# Patient Record
Sex: Female | Born: 1945
Health system: Southern US, Community
[De-identification: ages and names within clinical notes are randomized; demographics above are authoritative.]

## PROBLEM LIST (undated history)

## (undated) DIAGNOSIS — E785 Hyperlipidemia, unspecified: Secondary | ICD-10-CM

## (undated) DIAGNOSIS — E119 Type 2 diabetes mellitus without complications: Secondary | ICD-10-CM

## (undated) HISTORY — DX: Hyperlipidemia, unspecified: E78.5

## (undated) HISTORY — DX: Type 2 diabetes mellitus without complications: E11.9

---

## 1999-05-11 ENCOUNTER — Other Ambulatory Visit: Admission: RE | Admit: 1999-05-11 | Discharge: 1999-05-11 | Payer: Self-pay | Admitting: Family Medicine

## 2003-01-19 ENCOUNTER — Other Ambulatory Visit: Admission: RE | Admit: 2003-01-19 | Discharge: 2003-01-19 | Payer: Self-pay | Admitting: Family Medicine

## 2003-06-06 ENCOUNTER — Encounter: Admission: RE | Admit: 2003-06-06 | Discharge: 2003-06-06 | Payer: Self-pay | Admitting: Family Medicine

## 2004-02-16 ENCOUNTER — Other Ambulatory Visit: Admission: RE | Admit: 2004-02-16 | Discharge: 2004-02-16 | Payer: Self-pay | Admitting: Family Medicine

## 2004-09-14 ENCOUNTER — Encounter: Admission: RE | Admit: 2004-09-14 | Discharge: 2004-09-14 | Payer: Self-pay | Admitting: Family Medicine

## 2005-07-15 ENCOUNTER — Other Ambulatory Visit: Admission: RE | Admit: 2005-07-15 | Discharge: 2005-07-15 | Payer: Self-pay | Admitting: Family Medicine

## 2005-08-07 ENCOUNTER — Ambulatory Visit: Payer: Self-pay | Admitting: Gastroenterology

## 2005-08-27 ENCOUNTER — Ambulatory Visit: Payer: Self-pay | Admitting: Gastroenterology

## 2005-11-26 ENCOUNTER — Encounter: Admission: RE | Admit: 2005-11-26 | Discharge: 2005-11-26 | Payer: Self-pay | Admitting: Family Medicine

## 2006-12-22 ENCOUNTER — Encounter: Admission: RE | Admit: 2006-12-22 | Discharge: 2006-12-22 | Payer: Self-pay | Admitting: Family Medicine

## 2008-04-26 ENCOUNTER — Encounter: Admission: RE | Admit: 2008-04-26 | Discharge: 2008-04-26 | Payer: Self-pay | Admitting: Family Medicine

## 2009-06-06 ENCOUNTER — Encounter: Admission: RE | Admit: 2009-06-06 | Discharge: 2009-06-06 | Payer: Self-pay | Admitting: Geriatric Medicine

## 2010-11-08 ENCOUNTER — Other Ambulatory Visit: Payer: Self-pay | Admitting: Geriatric Medicine

## 2010-11-08 DIAGNOSIS — Z1231 Encounter for screening mammogram for malignant neoplasm of breast: Secondary | ICD-10-CM

## 2010-11-19 ENCOUNTER — Ambulatory Visit
Admission: RE | Admit: 2010-11-19 | Discharge: 2010-11-19 | Disposition: A | Discharge status: Home / Self Care | Payer: Medicare Other | Source: Ambulatory Visit | Attending: Geriatric Medicine | Admitting: Geriatric Medicine

## 2010-11-19 DIAGNOSIS — Z1231 Encounter for screening mammogram for malignant neoplasm of breast: Secondary | ICD-10-CM

## 2014-01-13 ENCOUNTER — Encounter: Payer: Self-pay | Admitting: *Deleted

## 2015-03-31 ENCOUNTER — Other Ambulatory Visit: Payer: Self-pay

## 2015-03-31 DIAGNOSIS — Z1231 Encounter for screening mammogram for malignant neoplasm of breast: Secondary | ICD-10-CM

## 2015-04-13 ENCOUNTER — Ambulatory Visit: Payer: Medicare Other

## 2015-04-20 ENCOUNTER — Ambulatory Visit
Admission: RE | Admit: 2015-04-20 | Discharge: 2015-04-20 | Disposition: A | Discharge status: Home / Self Care | Payer: Medicare Other | Source: Ambulatory Visit

## 2015-04-20 DIAGNOSIS — Z1231 Encounter for screening mammogram for malignant neoplasm of breast: Secondary | ICD-10-CM

## 2015-08-31 ENCOUNTER — Encounter: Payer: Self-pay | Admitting: Gastroenterology

## 2017-04-01 ENCOUNTER — Other Ambulatory Visit: Payer: Self-pay | Admitting: Geriatric Medicine

## 2017-04-01 DIAGNOSIS — Z1231 Encounter for screening mammogram for malignant neoplasm of breast: Secondary | ICD-10-CM

## 2017-04-18 ENCOUNTER — Ambulatory Visit: Payer: Medicare Other

## 2017-04-24 ENCOUNTER — Ambulatory Visit: Payer: Medicare Other

## 2017-05-09 ENCOUNTER — Ambulatory Visit
Admission: RE | Admit: 2017-05-09 | Discharge: 2017-05-09 | Disposition: A | Discharge status: Home / Self Care | Payer: Medicare Other | Source: Ambulatory Visit | Attending: Geriatric Medicine | Admitting: Geriatric Medicine

## 2017-05-09 ENCOUNTER — Encounter: Payer: Self-pay | Admitting: Radiology

## 2017-05-09 DIAGNOSIS — Z1231 Encounter for screening mammogram for malignant neoplasm of breast: Secondary | ICD-10-CM

## 2019-08-06 ENCOUNTER — Ambulatory Visit: Payer: Medicare PPO | Attending: Internal Medicine

## 2019-08-06 DIAGNOSIS — Z23 Encounter for immunization: Secondary | ICD-10-CM | POA: Insufficient documentation

## 2019-08-06 NOTE — Progress Notes (Signed)
   Covid-19 Vaccination Clinic  Name:  MELYNA HURON    MRN: [id number] DOB: Jun 18, 1945  08/06/2019  Ms. Broxterman was observed post Covid-19 immunization for 15 minutes without incidence. She was provided with Vaccine Information Sheet and instruction to access the V-Safe system.   Ms. Capell was instructed to call 911 with any severe reactions post vaccine: Marland Kitchen Difficulty breathing  . Swelling of your face and throat  . A fast heartbeat  . A bad rash all over your body  . Dizziness and weakness    Immunizations Administered    Name Date Dose VIS Date Route   Pfizer COVID-19 Vaccine 08/06/2019  8:23 AM 0.3 mL 05/21/2019 Intramuscular   Manufacturer: [employer], [employer]   Lot: [id number]   NDC: [address]-3

## 2019-08-31 ENCOUNTER — Ambulatory Visit: Payer: Medicare PPO | Attending: Internal Medicine

## 2019-08-31 DIAGNOSIS — Z23 Encounter for immunization: Secondary | ICD-10-CM

## 2019-08-31 NOTE — Progress Notes (Signed)
   Covid-19 Vaccination Clinic  Name:  VAIDEHI BRADDY    MRN: [id number] DOB: 1945-11-12  08/31/2019  Ms. Babineau was observed post Covid-19 immunization for 15 minutes without incident. She was provided with Vaccine Information Sheet and instruction to access the V-Safe system.   Ms. Dossantos was instructed to call 911 with any severe reactions post vaccine: Marland Kitchen Difficulty breathing  . Swelling of face and throat  . A fast heartbeat  . A bad rash all over body  . Dizziness and weakness   Immunizations Administered    Name Date Dose VIS Date Route   Pfizer COVID-19 Vaccine 08/31/2019  8:30 AM 0.3 mL 05/21/2019 Intramuscular   Manufacturer: [employer], [employer]   Lot: [id number]   NDC: [address]-0

## 2019-10-03 DIAGNOSIS — M810 Age-related osteoporosis without current pathological fracture: Secondary | ICD-10-CM | POA: Diagnosis not present

## 2019-10-03 DIAGNOSIS — Z7984 Long term (current) use of oral hypoglycemic drugs: Secondary | ICD-10-CM | POA: Diagnosis not present

## 2019-10-03 DIAGNOSIS — I10 Essential (primary) hypertension: Secondary | ICD-10-CM | POA: Diagnosis not present

## 2019-10-03 DIAGNOSIS — E1165 Type 2 diabetes mellitus with hyperglycemia: Secondary | ICD-10-CM | POA: Diagnosis not present

## 2019-10-03 DIAGNOSIS — E1169 Type 2 diabetes mellitus with other specified complication: Secondary | ICD-10-CM | POA: Diagnosis not present

## 2019-10-03 DIAGNOSIS — E7800 Pure hypercholesterolemia, unspecified: Secondary | ICD-10-CM | POA: Diagnosis not present

## 2019-11-01 DIAGNOSIS — E7800 Pure hypercholesterolemia, unspecified: Secondary | ICD-10-CM | POA: Diagnosis not present

## 2019-11-01 DIAGNOSIS — E1169 Type 2 diabetes mellitus with other specified complication: Secondary | ICD-10-CM | POA: Diagnosis not present

## 2019-11-01 DIAGNOSIS — M810 Age-related osteoporosis without current pathological fracture: Secondary | ICD-10-CM | POA: Diagnosis not present

## 2019-11-01 DIAGNOSIS — E1165 Type 2 diabetes mellitus with hyperglycemia: Secondary | ICD-10-CM | POA: Diagnosis not present

## 2019-11-01 DIAGNOSIS — I10 Essential (primary) hypertension: Secondary | ICD-10-CM | POA: Diagnosis not present

## 2019-11-24 DIAGNOSIS — E7800 Pure hypercholesterolemia, unspecified: Secondary | ICD-10-CM | POA: Diagnosis not present

## 2019-11-24 DIAGNOSIS — E1169 Type 2 diabetes mellitus with other specified complication: Secondary | ICD-10-CM | POA: Diagnosis not present

## 2019-11-24 DIAGNOSIS — I10 Essential (primary) hypertension: Secondary | ICD-10-CM | POA: Diagnosis not present

## 2019-11-24 DIAGNOSIS — E1165 Type 2 diabetes mellitus with hyperglycemia: Secondary | ICD-10-CM | POA: Diagnosis not present

## 2019-11-24 DIAGNOSIS — M810 Age-related osteoporosis without current pathological fracture: Secondary | ICD-10-CM | POA: Diagnosis not present

## 2019-12-21 DIAGNOSIS — M810 Age-related osteoporosis without current pathological fracture: Secondary | ICD-10-CM | POA: Diagnosis not present

## 2019-12-21 DIAGNOSIS — E7800 Pure hypercholesterolemia, unspecified: Secondary | ICD-10-CM | POA: Diagnosis not present

## 2019-12-21 DIAGNOSIS — I10 Essential (primary) hypertension: Secondary | ICD-10-CM | POA: Diagnosis not present

## 2019-12-21 DIAGNOSIS — E1165 Type 2 diabetes mellitus with hyperglycemia: Secondary | ICD-10-CM | POA: Diagnosis not present

## 2019-12-21 DIAGNOSIS — E1169 Type 2 diabetes mellitus with other specified complication: Secondary | ICD-10-CM | POA: Diagnosis not present

## 2020-02-08 DIAGNOSIS — E1169 Type 2 diabetes mellitus with other specified complication: Secondary | ICD-10-CM | POA: Diagnosis not present

## 2020-02-08 DIAGNOSIS — Z7984 Long term (current) use of oral hypoglycemic drugs: Secondary | ICD-10-CM | POA: Diagnosis not present

## 2020-02-08 DIAGNOSIS — E1165 Type 2 diabetes mellitus with hyperglycemia: Secondary | ICD-10-CM | POA: Diagnosis not present

## 2020-02-08 DIAGNOSIS — I10 Essential (primary) hypertension: Secondary | ICD-10-CM | POA: Diagnosis not present

## 2020-02-08 DIAGNOSIS — E7800 Pure hypercholesterolemia, unspecified: Secondary | ICD-10-CM | POA: Diagnosis not present

## 2020-02-08 DIAGNOSIS — M810 Age-related osteoporosis without current pathological fracture: Secondary | ICD-10-CM | POA: Diagnosis not present

## 2020-02-27 ENCOUNTER — Other Ambulatory Visit: Payer: Self-pay

## 2020-02-27 ENCOUNTER — Encounter (HOSPITAL_COMMUNITY): Payer: Self-pay

## 2020-02-27 ENCOUNTER — Emergency Department (HOSPITAL_COMMUNITY): Payer: Medicare PPO

## 2020-02-27 ENCOUNTER — Inpatient Hospital Stay (HOSPITAL_COMMUNITY)
Admission: EM | Admit: 2020-02-27 | Discharge: 2020-03-03 | DRG: 814 | Disposition: A | Discharge status: Home Health | Payer: Medicare PPO | Attending: General Surgery | Admitting: General Surgery

## 2020-02-27 DIAGNOSIS — S36039A Unspecified laceration of spleen, initial encounter: Secondary | ICD-10-CM

## 2020-02-27 DIAGNOSIS — Z7984 Long term (current) use of oral hypoglycemic drugs: Secondary | ICD-10-CM

## 2020-02-27 DIAGNOSIS — Z7983 Long term (current) use of bisphosphonates: Secondary | ICD-10-CM

## 2020-02-27 DIAGNOSIS — W19XXXA Unspecified fall, initial encounter: Secondary | ICD-10-CM | POA: Diagnosis not present

## 2020-02-27 DIAGNOSIS — N390 Urinary tract infection, site not specified: Secondary | ICD-10-CM | POA: Diagnosis not present

## 2020-02-27 DIAGNOSIS — Z20822 Contact with and (suspected) exposure to covid-19: Secondary | ICD-10-CM | POA: Diagnosis not present

## 2020-02-27 DIAGNOSIS — K661 Hemoperitoneum: Secondary | ICD-10-CM | POA: Diagnosis not present

## 2020-02-27 DIAGNOSIS — I959 Hypotension, unspecified: Secondary | ICD-10-CM | POA: Diagnosis not present

## 2020-02-27 DIAGNOSIS — E119 Type 2 diabetes mellitus without complications: Secondary | ICD-10-CM | POA: Diagnosis present

## 2020-02-27 DIAGNOSIS — W109XXA Fall (on) (from) unspecified stairs and steps, initial encounter: Secondary | ICD-10-CM | POA: Diagnosis present

## 2020-02-27 DIAGNOSIS — R42 Dizziness and giddiness: Secondary | ICD-10-CM | POA: Diagnosis not present

## 2020-02-27 DIAGNOSIS — S2242XA Multiple fractures of ribs, left side, initial encounter for closed fracture: Secondary | ICD-10-CM | POA: Diagnosis present

## 2020-02-27 DIAGNOSIS — E785 Hyperlipidemia, unspecified: Secondary | ICD-10-CM | POA: Diagnosis present

## 2020-02-27 DIAGNOSIS — I10 Essential (primary) hypertension: Secondary | ICD-10-CM | POA: Diagnosis not present

## 2020-02-27 DIAGNOSIS — K760 Fatty (change of) liver, not elsewhere classified: Secondary | ICD-10-CM | POA: Diagnosis not present

## 2020-02-27 DIAGNOSIS — Y92008 Other place in unspecified non-institutional (private) residence as the place of occurrence of the external cause: Secondary | ICD-10-CM | POA: Diagnosis not present

## 2020-02-27 DIAGNOSIS — M545 Low back pain: Secondary | ICD-10-CM | POA: Diagnosis not present

## 2020-02-27 DIAGNOSIS — B9620 Unspecified Escherichia coli [E. coli] as the cause of diseases classified elsewhere: Secondary | ICD-10-CM | POA: Diagnosis present

## 2020-02-27 DIAGNOSIS — Z043 Encounter for examination and observation following other accident: Secondary | ICD-10-CM | POA: Diagnosis not present

## 2020-02-27 DIAGNOSIS — S36030A Superficial (capsular) laceration of spleen, initial encounter: Secondary | ICD-10-CM | POA: Diagnosis not present

## 2020-02-27 DIAGNOSIS — S3681XA Injury of peritoneum, initial encounter: Secondary | ICD-10-CM | POA: Diagnosis not present

## 2020-02-27 DIAGNOSIS — Z79899 Other long term (current) drug therapy: Secondary | ICD-10-CM

## 2020-02-27 DIAGNOSIS — R55 Syncope and collapse: Secondary | ICD-10-CM | POA: Diagnosis not present

## 2020-02-27 DIAGNOSIS — S36031A Moderate laceration of spleen, initial encounter: Secondary | ICD-10-CM | POA: Diagnosis not present

## 2020-02-27 DIAGNOSIS — I371 Nonrheumatic pulmonary valve insufficiency: Secondary | ICD-10-CM | POA: Diagnosis not present

## 2020-02-27 DIAGNOSIS — R296 Repeated falls: Secondary | ICD-10-CM | POA: Diagnosis present

## 2020-02-27 LAB — COMPREHENSIVE METABOLIC PANEL
ALT: 19 U/L (ref 0–44)
AST: 23 U/L (ref 15–41)
Albumin: 3.6 g/dL (ref 3.5–5.0)
Alkaline Phosphatase: 66 U/L (ref 38–126)
Anion gap: 8 (ref 5–15)
BUN: 23 mg/dL (ref 8–23)
CO2: 26 mmol/L (ref 22–32)
Calcium: 8 mg/dL — ABNORMAL LOW (ref 8.9–10.3)
Chloride: 104 mmol/L (ref 98–111)
Creatinine, Ser: 0.83 mg/dL (ref 0.44–1.00)
GFR calc Af Amer: >60 mL/min (ref >60)
GFR calc non Af Amer: >60 mL/min (ref >60)
Glucose, Bld: 220 mg/dL — ABNORMAL HIGH (ref 70–99)
Potassium: 4.7 mmol/L (ref 3.5–5.1)
Sodium: 138 mmol/L (ref 135–145)
Total Bilirubin: 0.5 mg/dL (ref 0.3–1.2)
Total Protein: 6.7 g/dL (ref 6.5–8.1)

## 2020-02-27 LAB — CBC WITH DIFFERENTIAL/PLATELET
Abs Immature Granulocytes: 0.09 10*3/uL — ABNORMAL HIGH (ref 0.00–0.07)
Basophils Absolute: 0 10*3/uL (ref 0.0–0.1)
Basophils Relative: 0 %
Eosinophils Absolute: 0.1 10*3/uL (ref 0.0–0.5)
Eosinophils Relative: 1 %
HCT: 25.1 % — ABNORMAL LOW (ref 36.0–46.0)
Hemoglobin: 8.4 g/dL — ABNORMAL LOW (ref 12.0–15.0)
Immature Granulocytes: 1 %
Lymphocytes Relative: 9 %
Lymphs Abs: 1.4 10*3/uL (ref 0.7–4.0)
MCH: 32.2 pg (ref 26.0–34.0)
MCHC: 33.5 g/dL (ref 30.0–36.0)
MCV: 96.2 fL (ref 80.0–100.0)
Monocytes Absolute: 1 10*3/uL (ref 0.1–1.0)
Monocytes Relative: 7 %
Neutro Abs: 12 10*3/uL — ABNORMAL HIGH (ref 1.7–7.7)
Neutrophils Relative %: 82 %
Platelets: 127 10*3/uL — ABNORMAL LOW (ref 150–400)
RBC: 2.61 MIL/uL — ABNORMAL LOW (ref 3.87–5.11)
RDW: 12.5 % (ref 11.5–15.5)
WBC: 14.7 10*3/uL — ABNORMAL HIGH (ref 4.0–10.5)
nRBC: 0 % (ref 0.0–0.2)

## 2020-02-27 LAB — TYPE AND SCREEN
ABO/RH(D): O NEG
Antibody Screen: NEGATIVE

## 2020-02-27 LAB — CBC
HCT: 21.3 % — ABNORMAL LOW (ref 36.0–46.0)
Hemoglobin: 7 g/dL — ABNORMAL LOW (ref 12.0–15.0)
MCH: 31.4 pg (ref 26.0–34.0)
MCHC: 32.9 g/dL (ref 30.0–36.0)
MCV: 95.5 fL (ref 80.0–100.0)
Platelets: 132 10*3/uL — ABNORMAL LOW (ref 150–400)
RBC: 2.23 MIL/uL — ABNORMAL LOW (ref 3.87–5.11)
RDW: 12.4 % (ref 11.5–15.5)
WBC: 10.5 10*3/uL (ref 4.0–10.5)
nRBC: 0 % (ref 0.0–0.2)

## 2020-02-27 LAB — CBG MONITORING, ED: Glucose-Capillary: 203 mg/dL — ABNORMAL HIGH (ref 70–99)

## 2020-02-27 LAB — HEMOGLOBIN A1C
Hgb A1c MFr Bld: 7.9 % — ABNORMAL HIGH (ref 4.8–5.6)
Mean Plasma Glucose: 180.03 mg/dL

## 2020-02-27 LAB — GLUCOSE, CAPILLARY: Glucose-Capillary: 251 mg/dL — ABNORMAL HIGH (ref 70–99)

## 2020-02-27 LAB — TROPONIN I (HIGH SENSITIVITY): Troponin I (High Sensitivity): 3 ng/L (ref <18)

## 2020-02-27 LAB — POC OCCULT BLOOD, ED: Fecal Occult Bld: NEGATIVE

## 2020-02-27 LAB — MRSA PCR SCREENING: MRSA by PCR: NEGATIVE

## 2020-02-27 LAB — SARS CORONAVIRUS 2 BY RT PCR (HOSPITAL ORDER, PERFORMED IN ~~LOC~~ HOSPITAL LAB): SARS Coronavirus 2: NEGATIVE

## 2020-02-27 MED ORDER — IOHEXOL 350 MG/ML SOLN
100.0000 mL | Freq: Once | INTRAVENOUS | Status: AC | PRN
  Administered 2020-02-27: 100 mL via INTRAVENOUS

## 2020-02-27 MED ORDER — INSULIN ASPART 100 UNIT/ML ~~LOC~~ SOLN: 0.0000 [IU] | SUBCUTANEOUS | Status: DC

## 2020-02-27 MED ORDER — SODIUM CHLORIDE 0.9 % IV SOLN: INTRAVENOUS | Status: DC

## 2020-02-27 MED ORDER — IOHEXOL 300 MG/ML  SOLN: 100.0000 mL | Freq: Once | INTRAMUSCULAR | Status: DC | PRN

## 2020-02-27 MED ORDER — MORPHINE SULFATE (PF) 2 MG/ML IV SOLN: 1.0000 mg | INTRAVENOUS | Status: DC | PRN

## 2020-02-27 MED ORDER — ATORVASTATIN CALCIUM 40 MG PO TABS
40.0000 mg | ORAL_TABLET | Freq: Every day | ORAL | Status: DC
  Administered 2020-02-27 – 2020-03-02 (×5): 40 mg via ORAL
  Filled 2020-02-27 (×5): qty 1

## 2020-02-27 MED ORDER — ALENDRONATE SODIUM 70 MG PO TABS: 70.0000 mg | ORAL_TABLET | ORAL | Status: DC

## 2020-02-27 MED ORDER — EZETIMIBE 10 MG PO TABS
10.0000 mg | ORAL_TABLET | Freq: Every morning | ORAL | Status: DC
  Administered 2020-02-28 – 2020-03-03 (×5): 10 mg via ORAL
  Filled 2020-02-27 (×5): qty 1

## 2020-02-27 MED ORDER — PANTOPRAZOLE SODIUM 40 MG PO TBEC
40.0000 mg | DELAYED_RELEASE_TABLET | Freq: Every day | ORAL | Status: DC
  Administered 2020-02-27 – 2020-03-03 (×6): 40 mg via ORAL
  Filled 2020-02-27 (×6): qty 1

## 2020-02-27 MED ORDER — PANTOPRAZOLE SODIUM 40 MG IV SOLR: 40.0000 mg | Freq: Every day | INTRAVENOUS | Status: DC

## 2020-02-27 MED ORDER — INSULIN ASPART 100 UNIT/ML ~~LOC~~ SOLN
0.0000 [IU] | SUBCUTANEOUS | Status: DC
  Administered 2020-02-27: 8 [IU] via SUBCUTANEOUS
  Administered 2020-02-28: 5 [IU] via SUBCUTANEOUS

## 2020-02-27 MED ORDER — ONDANSETRON 4 MG PO TBDP: 4.0000 mg | ORAL_TABLET | Freq: Four times a day (QID) | ORAL | Status: DC | PRN

## 2020-02-27 MED ORDER — SODIUM CHLORIDE 0.9 % IV BOLUS
1000.0000 mL | Freq: Once | INTRAVENOUS | Status: AC
  Administered 2020-02-27: 1000 mL via INTRAVENOUS

## 2020-02-27 MED ORDER — ONDANSETRON HCL 4 MG/2ML IJ SOLN
4.0000 mg | Freq: Four times a day (QID) | INTRAMUSCULAR | Status: DC | PRN
  Administered 2020-02-27 – 2020-02-29 (×2): 4 mg via INTRAVENOUS
  Filled 2020-02-27 (×2): qty 2

## 2020-02-27 MED ORDER — HYDROMORPHONE HCL 1 MG/ML IJ SOLN
0.5000 mg | Freq: Once | INTRAMUSCULAR | Status: DC
  Filled 2020-02-27: qty 1

## 2020-02-27 NOTE — ED Notes (Signed)
Report called to Gardiner Ramus, RN at [facility] ICU

## 2020-02-27 NOTE — Progress Notes (Signed)
Patient received on 4NICU.

## 2020-02-27 NOTE — ED Notes (Signed)
[facility] called for pt transport  

## 2020-02-27 NOTE — H&P (Signed)
Norma Clark is an 74 y.o. female.   Chief Complaint: Fall HPI: The patient is a 74 year old white female who presents after standing up and getting lightheaded and falling down a couple of stairs at home today.  She complains of some left-sided chest pain and some abdominal pain.  She denies any neck pain.  She denies any headaches.  She states that she also fell a week ago on the left side as well and at that time she felt like she broke some ribs.  She never went to the doctor to be worked up.  Past Medical History:  Diagnosis Date  . Diabetes mellitus without complication (HCC)   . Hyperlipidemia     History reviewed. No pertinent surgical history.  Family History  Problem Relation Age of Onset  . Alcohol abuse Mother   . Heart failure Father    Social History:  reports that she has never smoked. She has never used smokeless tobacco. She reports that she does not drink alcohol and does not use drugs.  Allergies: No Known Allergies  (Not in a hospital admission)   Results for orders placed or performed during the hospital encounter of 02/27/20 (from the past 48 hour(s))  CBG monitoring, ED     Status: Abnormal   Collection Time: 02/27/20  4:02 PM  Result Value Ref Range   Glucose-Capillary 203 (H) 70 - 99 mg/dL    Comment: Glucose reference range applies only to samples taken after fasting for at least 8 hours.  CBC WITH DIFFERENTIAL     Status: Abnormal   Collection Time: 02/27/20  4:25 PM  Result Value Ref Range   WBC 14.7 (H) 4.0 - 10.5 K/uL   RBC 2.61 (L) 3.87 - 5.11 MIL/uL   Hemoglobin 8.4 (L) 12.0 - 15.0 g/dL   HCT 29.5 (L) 36 - 46 %   MCV 96.2 80.0 - 100.0 fL   MCH 32.2 26.0 - 34.0 pg   MCHC 33.5 30.0 - 36.0 g/dL   RDW 28.4 13.2 - 44.0 %   Platelets 127 (L) 150 - 400 K/uL   nRBC 0.0 0.0 - 0.2 %   Neutrophils Relative % 82 %   Neutro Abs 12.0 (H) 1.7 - 7.7 K/uL   Lymphocytes Relative 9 %   Lymphs Abs 1.4 0.7 - 4.0 K/uL   Monocytes Relative 7 %    Monocytes Absolute 1.0 0 - 1 K/uL   Eosinophils Relative 1 %   Eosinophils Absolute 0.1 0 - 0 K/uL   Basophils Relative 0 %   Basophils Absolute 0.0 0 - 0 K/uL   Immature Granulocytes 1 %   Abs Immature Granulocytes 0.09 (H) 0.00 - 0.07 K/uL    Comment: Performed at [facility], [address], [place], [place] [address]  Comprehensive metabolic panel     Status: Abnormal   Collection Time: 02/27/20  4:25 PM  Result Value Ref Range   Sodium 138 135 - 145 mmol/L   Potassium 4.7 3.5 - 5.1 mmol/L   Chloride 104 98 - 111 mmol/L   CO2 26 22 - 32 mmol/L   Glucose, Bld 220 (H) 70 - 99 mg/dL    Comment: Glucose reference range applies only to samples taken after fasting for at least 8 hours.   BUN 23 8 - 23 mg/dL   Creatinine, Ser 5.36 0.44 - 1.00 mg/dL   Calcium 8.0 (L) 8.9 - 10.3 mg/dL   Total Protein 6.7 6.5 - 8.1 g/dL  Albumin 3.6 3.5 - 5.0 g/dL   AST 23 15 - 41 U/L   ALT 19 0 - 44 U/L   Alkaline Phosphatase 66 38 - 126 U/L   Total Bilirubin 0.5 0.3 - 1.2 mg/dL   GFR calc non Af Amer >60 >60 mL/min   GFR calc Af Amer >60 >60 mL/min   Anion gap 8 5 - 15    Comment: Performed at [facility], [address] [place], [place] [address]  Troponin I (High Sensitivity)     Status: None   Collection Time: 02/27/20  4:25 PM  Result Value Ref Range   Troponin I (High Sensitivity) 3 <18 ng/L    Comment: (NOTE) Elevated high sensitivity troponin I (hsTnI) values and significant  changes across serial measurements may suggest ACS but many other  chronic and acute conditions are known to elevate hsTnI results.  Refer to the "Links" section for chest pain algorithms and additional  guidance. Performed at [facility], [address], [place], [place] [address]   POC occult blood, ED Provider will collect     Status: None   Collection Time: 02/27/20  4:48 PM  Result Value Ref Range   Fecal Occult Bld NEGATIVE NEGATIVE   DG Ribs  Unilateral W/Chest Left  Result Date: 02/27/2020 CLINICAL DATA:  Fall EXAM: LEFT RIBS AND CHEST - 3+ VIEW COMPARISON:  None. FINDINGS: There is minimally displaced fractures of the lateral left sixth, seventh, and ninth ribs. No pneumothorax is noted. No focal airspace consolidation. There is no evidence of pneumothorax or pleural effusion. Both lungs are clear. There is mild cardiomegaly. Aortic knob calcifications are seen. IMPRESSION: Mildly displaced lateral sixth, seventh, and ninth left rib fractures. Electronically Signed   By: Jonna Clark M.D.   On: 02/27/2020 17:42   DG Lumbar Spine Complete  Result Date: 02/27/2020 CLINICAL DATA:  Patient fell 2 days ago and then again today.  Pain. EXAM: LUMBAR SPINE - COMPLETE 4+ VIEW COMPARISON:  None. FINDINGS: Mild multilevel degenerative disc disease. No fracture or traumatic malalignment. Minimal calcified atherosclerosis in the abdominal aorta. IMPRESSION: 1. No fracture or traumatic malalignment. 2. Mild degenerative changes. 3. Mild calcified atherosclerosis in the abdominal aorta. Electronically Signed   By: Gerome Sam III M.D   On: 02/27/2020 17:44   DG Elbow Complete Left  Result Date: 02/27/2020 CLINICAL DATA:  Fall EXAM: LEFT ELBOW - COMPLETE 3+ VIEW COMPARISON:  None. FINDINGS: There is no evidence of fracture, dislocation, or joint effusion. There is no evidence of arthropathy or other focal bone abnormality. Soft tissues are unremarkable. IMPRESSION: Negative. Electronically Signed   By: Jonna Clark M.D.   On: 02/27/2020 17:40   CT Head Wo Contrast  Result Date: 02/27/2020 CLINICAL DATA:  Nonspecific dizziness.  Two falls in the past week. EXAM: CT HEAD WITHOUT CONTRAST TECHNIQUE: Contiguous axial images were obtained from the base of the skull through the vertex without intravenous contrast. COMPARISON:  None. FINDINGS: Brain: No evidence of acute infarction, hemorrhage, hydrocephalus, extra-axial collection or mass lesion/mass  effect. Vascular: No hyperdense vessel or unexpected calcification. Skull: Normal. Negative for fracture or focal lesion. Sinuses/Orbits: No acute finding. Other: None. IMPRESSION: No acute intracranial abnormalities. No cause for the patient's symptoms identified. Electronically Signed   By: Gerome Sam III M.D   On: 02/27/2020 18:37   CT Angio Chest PE W and/or Wo Contrast  Result Date: 02/27/2020 CLINICAL DATA:  Multiple falls x1 week. EXAM: CT ANGIOGRAPHY  CHEST WITH CONTRAST TECHNIQUE: Multidetector CT imaging of the chest was performed using the standard protocol during bolus administration of intravenous contrast. Multiplanar CT image reconstructions and MIPs were obtained to evaluate the vascular anatomy. CONTRAST:  OMNIPAQUE IOHEXOL 350 MG/ML SOLN COMPARISON:  None. FINDINGS: Cardiovascular: There is mild to moderate severity calcification of the aortic arch. Satisfactory opacification of the pulmonary arteries to the segmental level. No evidence of pulmonary embolism. Normal heart size. No pericardial effusion. Mediastinum/Nodes: No enlarged mediastinal, hilar, or axillary lymph nodes. Thyroid gland, trachea, and esophagus demonstrate no significant findings. Lungs/Pleura: Mild to moderate severity atelectasis and/or infiltrate is seen within the left lung base. There is a small left pleural effusion. No pneumothorax is identified. Upper Abdomen: There is a mild amount of nonhemorrhagic perihepatic fluid. A 3.2 cm by 2.7 cm irregular appearing area of parenchymal low attenuation is seen within the inferolateral aspect of the spleen. An adjacent 6.5 cm linear splenic laceration is also noted. This extends to the level of the splenic hilum. A 2.3 cm x 2.6 cm pocket of inferior perisplenic fluid is noted (axial CT image 90, CT series number 7). Musculoskeletal: Acute, nondisplaced lateral fifth and sixth left rib fractures are seen. Review of the MIP images confirms the above findings.  IMPRESSION: 1. Grade 3 splenic laceration with a small pocket of inferolateral perisplenic fluid. 2. Acute fifth and sixth left rib fractures. 3. Mild to moderate severity left basilar atelectasis and/or infiltrate. 4. Small left pleural effusion. 5. Small amount of nonhemorrhagic perihepatic fluid. Aortic Atherosclerosis (ICD10-I70.0). Electronically Signed   By: Aram Candela M.D.   On: 02/27/2020 18:40   CT ABDOMEN PELVIS W CONTRAST  Result Date: 02/27/2020 CLINICAL DATA:  Status post multiple falls x1 week. EXAM: CT ABDOMEN AND PELVIS WITH CONTRAST TECHNIQUE: Multidetector CT imaging of the abdomen and pelvis was performed using the standard protocol following bolus administration of intravenous contrast. CONTRAST:  OMNIPAQUE IOHEXOL 350 MG/ML SOLN COMPARISON:  None. FINDINGS: Lower chest: Mild to moderate severity atelectasis and/or infiltrate is seen within the left lung base. There is a small left pleural effusion. Hepatobiliary: There is diffuse fatty infiltration of the liver parenchyma. No focal liver abnormality is seen. A mild amount of peri hepatic fluid is noted (approximately 43.85 Hounsfield units). No gallstones, gallbladder wall thickening, or biliary dilatation. Pancreas: Unremarkable. No pancreatic ductal dilatation or surrounding inflammatory changes. Spleen: A 2.8 cm x 4.8 cm area of irregular appearing parenchymal low attenuation is seen within the posterolateral aspect of the spleen. An adjacent 6.5 cm curvilinear splenic laceration is noted which extends to the level of the splenic hilum. A 3.0 cm x 2.4 cm pocket of inferolateral perisplenic fluid is noted. Adrenals/Urinary Tract: Adrenal glands are unremarkable. Kidneys are normal, without renal calculi, focal lesion, or hydronephrosis. Bladder is unremarkable. Stomach/Bowel: Stomach is within normal limits. Appendix appears normal. No evidence of bowel wall thickening, distention, or inflammatory changes. Vascular/Lymphatic:  There is moderate severity calcification of the abdominal aorta, without evidence of aneurysmal dilatation. No enlarged abdominal or pelvic lymph nodes. Reproductive: Uterus and bilateral adnexa are unremarkable. Other: No abdominal wall hernia or abnormality. A small amount of pelvic free fluid is seen (approximately 59.04 Hounsfield units). Musculoskeletal: Nondisplaced anterolateral fifth and sixth left rib fractures are seen. IMPRESSION: 1. Findings consistent with a grade 3 splenic laceration, as described above. 2. Nondisplaced anterolateral fifth and sixth left rib fractures. 3. Mild to moderate severity left basilar atelectasis and/or infiltrate. 4. Small left pleural effusion.  5. Small amount of perihepatic and pelvic free fluid. 6. Fatty liver parenchyma. 7. Moderate severity calcification of the abdominal aorta. Aortic Atherosclerosis (ICD10-I70.0). Electronically Signed   By: Aram Candelahaddeus  Houston M.D.   On: 02/27/2020 18:46   DG Shoulder Left  Result Date: 02/27/2020 CLINICAL DATA:  Fall EXAM: LEFT SHOULDER - 2+ VIEW COMPARISON:  None. FINDINGS: There is no evidence of fracture or dislocation. There is no evidence of arthropathy or other focal bone abnormality. Soft tissues are unremarkable. IMPRESSION: Negative. Electronically Signed   By: Jonna ClarkBindu  Avutu M.D.   On: 02/27/2020 17:41   DG Hip Unilat With Pelvis 2-3 Views Left  Result Date: 02/27/2020 CLINICAL DATA:  Fall EXAM: DG HIP (WITH OR WITHOUT PELVIS) 2-3V LEFT COMPARISON:  None. FINDINGS: There is no evidence of hip fracture or dislocation. There is no evidence of arthropathy or other focal bone abnormality. IMPRESSION: No displaced hip or pelvic fracture. Please note that plain radiographs are insensitive for hip and pelvic fracture. Recommend CT or MRI to more sensitively evaluate if there is high clinical suspicion. Electronically Signed   By: Lauralyn PrimesAlex  Bibbey M.D.   On: 02/27/2020 17:44    Review of Systems  Constitutional: Negative.    HENT: Negative.   Eyes: Negative.   Respiratory: Negative.   Cardiovascular: Negative.   Gastrointestinal: Positive for abdominal pain. Negative for nausea and vomiting.  Endocrine: Negative.   Genitourinary: Negative.   Musculoskeletal: Negative.  Negative for neck pain.  Skin: Negative.   Allergic/Immunologic: Negative.   Neurological: Positive for dizziness.  Hematological: Negative.   Psychiatric/Behavioral: Negative.     Blood pressure (!) 199/84, pulse (!) 107, temperature 98.4 F (36.9 C), temperature source Oral, resp. rate (!) 22, height 5\' 2"  (1.575 m), weight 51.3 kg, SpO2 98 %. Physical Exam Constitutional:      Appearance: Normal appearance. She is not ill-appearing.  HENT:     Head: Normocephalic and atraumatic.     Right Ear: External ear normal.     Left Ear: External ear normal.     Nose: Nose normal.     Mouth/Throat:     Mouth: Mucous membranes are moist.  Eyes:     General: No scleral icterus.    Extraocular Movements: Extraocular movements intact.     Conjunctiva/sclera: Conjunctivae normal.     Pupils: Pupils are equal, round, and reactive to light.  Cardiovascular:     Rate and Rhythm: Normal rate and regular rhythm.     Pulses: Normal pulses.     Heart sounds: Normal heart sounds.     Comments: No pitting edema lower extr Pulmonary:     Effort: Pulmonary effort is normal. No respiratory distress.     Breath sounds: Normal breath sounds.  Abdominal:     General: There is distension.     Palpations: Abdomen is soft.     Tenderness: There is abdominal tenderness.  Musculoskeletal:        General: No tenderness or deformity. Normal range of motion.     Cervical back: Normal range of motion and neck supple. No tenderness.  Lymphadenopathy:     Comments: No groin or cervical lymphadenopathy  Skin:    General: Skin is warm and dry.     Coloration: Skin is not jaundiced.     Findings: No rash.  Neurological:     General: No focal deficit  present.     Mental Status: She is alert and oriented to person, place, and time.  Psychiatric:  Mood and Affect: Mood normal.        Behavior: Behavior normal.      Assessment/Plan The patient appears to have fallen down a couple of stairs.  She has suffered 3 left rib fractures and a grade 3 laceration to the spleen.  There is only a mild amount of free fluid in the abdominal cavity.  At this point I think she needs to be admitted to the trauma service at [facility].  She will need to be in the ICU for close observation.  She will need serial exams cardiac monitoring and serial hemoglobins.  I will arrange for the transfer.  She may also require a medicine consult to work-up her syncope although it is possible that these injuries occurred with her fall a week ago and that these injuries caused her to be lightheaded today.  Chevis Pretty III, MD 02/27/2020, 7:02 PM

## 2020-02-27 NOTE — ED Triage Notes (Signed)
Per EMs- patient has had 2 fall in the past week. Today, the patient reports that she tried to stand and felt like she was going to pass out and landed on the floor. Patient reports that on the previous falls that she had left elbow, left shoulder, and left hip pain and the episode today made those thing s hurt more. Patient was pale as per EMs,  Patient was given NS 500 ml prior to arrival to the ED.

## 2020-02-27 NOTE — ED Provider Notes (Signed)
[facility] Note   CSN: [id number] Arrival date & time: 02/27/20  1547     History Chief Complaint  Patient presents with  . Near Syncope    Norma Clark is a 74 y.o. female with past medical history of diabetes, hyperlipidemia that presents the emergency department today for near syncope via EMS.  Did not fully syncopize, no loss of consciousness.  Patient states that she fell a week ago, did not go to the emergency department for this.  States that she fell on her left elbow, left hip, left shoulder and left ribs. States that since then they have all been hurting her, states that when she fell today she landed on the same exact spots again.  Did not hit her head.  Is not on any blood thinners.  States that she did not feel nauseous, have tunneling vision, have chest pain, palpiations or shortness of breath before passing out.  Is denying any symptoms currently besides pain in those locations.  States that she is pretty generally healthy.  Lives alone with mom at home, mom takes care of her.  Denies any cardiac history.  Denies any numbness, tingling, weakness, facial droop, dysarthria, confusion.  Denies any dizziness, lightheadedness, vertigo-like symptoms.  States that when she syncopized today she had a" strange" feeling, across her, will not expand on this.  HPI     Past Medical History:  Diagnosis Date  . Diabetes mellitus without complication (HCC)   . Hyperlipidemia     Patient Active Problem List   Diagnosis Date Noted  . Spleen laceration 02/27/2020    History reviewed. No pertinent surgical history.   OB History   No obstetric history on file.     Family History  Problem Relation Age of Onset  . Alcohol abuse Mother   . Heart failure Father     Social History   Tobacco Use  . Smoking status: Never Smoker  . Smokeless tobacco: Never Used  Vaping Use  . Vaping Use: Never used  Substance Use Topics  .  Alcohol use: No  . Drug use: No    Home Medications Prior to Admission medications   Medication Sig Start Date End Date Taking? Authorizing Provider  alendronate (FOSAMAX) 70 MG tablet Take 70 mg by mouth every Monday. 02/03/20  Yes [provider]  atorvastatin (LIPITOR) 40 MG tablet Take 40 mg by mouth at bedtime.    Yes [provider]  Calcium Carb-Cholecalciferol (CALCIUM 600+D3) 600-800 MG-UNIT TABS Take 1 tablet by mouth every morning.   Yes [provider]  Cholecalciferol (VITAMIN D-3) 1000 UNITS CAPS Take 1,000 Units by mouth every morning.    Yes [provider]  ezetimibe (ZETIA) 10 MG tablet Take 10 mg by mouth every morning.    Yes [provider]  glimepiride (AMARYL) 4 MG tablet Take 2 mg by mouth every morning. 02/03/20  Yes [provider]  ibuprofen (ADVIL) 200 MG tablet Take 400 mg by mouth every 4 (four) hours as needed (pain).   Yes [provider]  lisinopril (ZESTRIL) 10 MG tablet Take 10 mg by mouth every morning. 02/03/20  Yes [provider]  metFORMIN (GLUCOPHAGE) 1000 MG tablet Take 1,000 mg by mouth 2 (two) times daily. 02/03/20  Yes [provider]    Allergies    Patient has no known allergies.  Review of Systems   Review of Systems  Constitutional: Negative for chills, diaphoresis, fatigue and fever.  HENT: Negative for congestion, sore throat and trouble swallowing.   Eyes: Negative for pain and visual disturbance.  Respiratory: Negative for cough, shortness of breath and wheezing.   Cardiovascular: Negative for chest pain, palpitations and leg swelling.  Gastrointestinal: Negative for abdominal distention, abdominal pain, diarrhea, nausea and vomiting.  Genitourinary: Negative for difficulty urinating.  Musculoskeletal: Positive for arthralgias. Negative for back pain, neck pain and neck stiffness.  Skin: Negative for pallor.  Neurological: Positive for syncope. Negative for  dizziness, seizures, facial asymmetry, speech difficulty, weakness, light-headedness, numbness and headaches.  Psychiatric/Behavioral: Negative for confusion.    Physical Exam Updated Vital Signs BP (!) 175/84   Pulse (!) 110   Temp 98.7 F (37.1 C)   Resp 16   Ht 5\' 2"  (1.575 m)   Wt 51.3 kg   SpO2 99%   BMI 20.67 kg/m   Physical Exam Constitutional:      General: She is not in acute distress.    Appearance: Normal appearance. She is not ill-appearing, toxic-appearing or diaphoretic.  HENT:     Head: Normocephalic and atraumatic.     Mouth/Throat:     Mouth: Mucous membranes are moist.     Pharynx: Oropharynx is clear.  Eyes:     General: No scleral icterus.    Extraocular Movements: Extraocular movements intact.     Pupils: Pupils are equal, round, and reactive to light.  Cardiovascular:     Rate and Rhythm: Normal rate and regular rhythm.     Pulses: Normal pulses.     Heart sounds: Normal heart sounds.  Pulmonary:     Effort: Pulmonary effort is normal. No respiratory distress.     Breath sounds: Normal breath sounds. No stridor. No wheezing, rhonchi or rales.  Chest:     Chest wall: No tenderness.  Abdominal:     General: Abdomen is flat. There is no distension.     Palpations: Abdomen is soft.     Tenderness: There is abdominal tenderness. There is no guarding or rebound.     Comments: Tenderness specifically to left upper quadrant  Genitourinary:    Comments: Chaperone present. Digital Rectal exam reveals sphincter with good tone. No external hemorrhoids, masses, or fissures. Stool color is brown with no overt blood. No gross melena.   Musculoskeletal:        General: No swelling or tenderness. Normal range of motion.     Cervical back: Normal range of motion and neck supple. No rigidity.     Right lower leg: No edema.     Left lower leg: No edema.     Comments: Tenderness to left elbow, left shoulder, normal range of motion to these areas.  Normal strength  and sensation.  Cap refill normal.  Radial pulse 2+  Lower extremities without any tenderness.  Normal strength and sensation.  PT 2+.  Skin:    General: Skin is warm and dry.     Capillary Refill: Capillary refill takes less than 2 seconds.     Coloration: Skin is not pale.  Neurological:     General: No focal deficit present.     Mental Status: She is alert and oriented to person, place, and time.  Psychiatric:        Mood and Affect: Mood normal.        Behavior: Behavior normal.     ED Results / Procedures / Treatments   Labs (all labs ordered are listed, but only abnormal results are displayed) Labs Reviewed  CBC WITH DIFFERENTIAL/PLATELET - Abnormal; Notable for the following components:      Result Value   WBC 14.7 (*)    RBC 2.61 (*)    Hemoglobin 8.4 (*)    HCT 25.1 (*)    Platelets 127 (*)    Neutro Abs 12.0 (*)    Abs Immature Granulocytes 0.09 (*)    All other components within normal limits  COMPREHENSIVE METABOLIC PANEL - Abnormal; Notable for the following components:   Glucose, Bld 220 (*)    Calcium 8.0 (*)    All other components within normal limits  CBG MONITORING, ED - Abnormal; Notable for the following components:   Glucose-Capillary 203 (*)    All other components within normal limits  SARS CORONAVIRUS 2 BY RT PCR (HOSPITAL ORDER, PERFORMED IN [facility])  URINALYSIS, ROUTINE W REFLEX MICROSCOPIC  POC OCCULT BLOOD, ED  TYPE AND SCREEN  TROPONIN I (HIGH SENSITIVITY)    EKG EKG Interpretation  Date/Time:  Sunday February 27 2020 16:01:50 EDT Ventricular Rate:  83 PR Interval:    QRS Duration: 123 QT Interval:  406 QTC Calculation: 478 R Axis:   67 Text Interpretation: Sinus rhythm Probable left atrial enlargement Nonspecific intraventricular conduction delay Probable anteroseptal infarct, recent No previous ECGs available Confirmed by Richardean Canal ([id number]) on 02/27/2020 4:33:36 PM   Radiology DG Ribs Unilateral W/Chest  Left  Result Date: 02/27/2020 CLINICAL DATA:  Fall EXAM: LEFT RIBS AND CHEST - 3+ VIEW COMPARISON:  None. FINDINGS: There is minimally displaced fractures of the lateral left sixth, seventh, and ninth ribs. No pneumothorax is noted. No focal airspace consolidation. There is no evidence of pneumothorax or pleural effusion. Both lungs are clear. There is mild cardiomegaly. Aortic knob calcifications are seen. IMPRESSION: Mildly displaced lateral sixth, seventh, and ninth left rib fractures. Electronically Signed   By: Jonna Clark M.D.   On: 02/27/2020 17:42   DG Lumbar Spine Complete  Result Date: 02/27/2020 CLINICAL DATA:  Patient fell 2 days ago and then again today.  Pain. EXAM: LUMBAR SPINE - COMPLETE 4+ VIEW COMPARISON:  None. FINDINGS: Mild multilevel degenerative disc disease. No fracture or traumatic malalignment. Minimal calcified atherosclerosis in the abdominal aorta. IMPRESSION: 1. No fracture or traumatic malalignment. 2. Mild degenerative changes. 3. Mild calcified atherosclerosis in the abdominal aorta. Electronically Signed   By: Gerome Sam III M.D   On: 02/27/2020 17:44   DG Elbow Complete Left  Result Date: 02/27/2020 CLINICAL DATA:  Fall EXAM: LEFT ELBOW - COMPLETE 3+ VIEW COMPARISON:  None. FINDINGS: There is no evidence of fracture, dislocation, or joint effusion. There is no evidence of arthropathy or other focal bone abnormality. Soft tissues are unremarkable. IMPRESSION: Negative. Electronically Signed   By: Jonna Clark M.D.   On: 02/27/2020 17:40   CT Head Wo Contrast  Result Date: 02/27/2020 CLINICAL DATA:  Nonspecific dizziness.  Two falls in the past week. EXAM: CT HEAD WITHOUT CONTRAST TECHNIQUE: Contiguous axial images were obtained from the base of the skull through the vertex without intravenous contrast. COMPARISON:  None. FINDINGS: Brain: No evidence of acute infarction, hemorrhage, hydrocephalus, extra-axial collection or mass lesion/mass effect. Vascular: No  hyperdense vessel or unexpected calcification. Skull: Normal. Negative for fracture or focal lesion. Sinuses/Orbits: No acute finding. Other: None. IMPRESSION: No acute intracranial abnormalities. No cause for the patient's symptoms identified. Electronically Signed   By: Gerome Sam III M.D   On: 02/27/2020 18:37   CT Angio Chest PE W  and/or Wo Contrast  Result Date: 02/27/2020 CLINICAL DATA:  Multiple falls x1 week. EXAM: CT ANGIOGRAPHY CHEST WITH CONTRAST TECHNIQUE: Multidetector CT imaging of the chest was performed using the standard protocol during bolus administration of intravenous contrast. Multiplanar CT image reconstructions and MIPs were obtained to evaluate the vascular anatomy. CONTRAST:  OMNIPAQUE IOHEXOL 350 MG/ML SOLN COMPARISON:  None. FINDINGS: Cardiovascular: There is mild to moderate severity calcification of the aortic arch. Satisfactory opacification of the pulmonary arteries to the segmental level. No evidence of pulmonary embolism. Normal heart size. No pericardial effusion. Mediastinum/Nodes: No enlarged mediastinal, hilar, or axillary lymph nodes. Thyroid gland, trachea, and esophagus demonstrate no significant findings. Lungs/Pleura: Mild to moderate severity atelectasis and/or infiltrate is seen within the left lung base. There is a small left pleural effusion. No pneumothorax is identified. Upper Abdomen: There is a mild amount of nonhemorrhagic perihepatic fluid. A 3.2 cm by 2.7 cm irregular appearing area of parenchymal low attenuation is seen within the inferolateral aspect of the spleen. An adjacent 6.5 cm linear splenic laceration is also noted. This extends to the level of the splenic hilum. A 2.3 cm x 2.6 cm pocket of inferior perisplenic fluid is noted (axial CT image 90, CT series number 7). Musculoskeletal: Acute, nondisplaced lateral fifth and sixth left rib fractures are seen. Review of the MIP images confirms the above findings. IMPRESSION: 1. Grade 3 splenic  laceration with a small pocket of inferolateral perisplenic fluid. 2. Acute fifth and sixth left rib fractures. 3. Mild to moderate severity left basilar atelectasis and/or infiltrate. 4. Small left pleural effusion. 5. Small amount of nonhemorrhagic perihepatic fluid. Aortic Atherosclerosis (ICD10-I70.0). Electronically Signed   By: Aram Candela M.D.   On: 02/27/2020 18:40   CT ABDOMEN PELVIS W CONTRAST  Result Date: 02/27/2020 CLINICAL DATA:  Status post multiple falls x1 week. EXAM: CT ABDOMEN AND PELVIS WITH CONTRAST TECHNIQUE: Multidetector CT imaging of the abdomen and pelvis was performed using the standard protocol following bolus administration of intravenous contrast. CONTRAST:  OMNIPAQUE IOHEXOL 350 MG/ML SOLN COMPARISON:  None. FINDINGS: Lower chest: Mild to moderate severity atelectasis and/or infiltrate is seen within the left lung base. There is a small left pleural effusion. Hepatobiliary: There is diffuse fatty infiltration of the liver parenchyma. No focal liver abnormality is seen. A mild amount of peri hepatic fluid is noted (approximately 43.85 Hounsfield units). No gallstones, gallbladder wall thickening, or biliary dilatation. Pancreas: Unremarkable. No pancreatic ductal dilatation or surrounding inflammatory changes. Spleen: A 2.8 cm x 4.8 cm area of irregular appearing parenchymal low attenuation is seen within the posterolateral aspect of the spleen. An adjacent 6.5 cm curvilinear splenic laceration is noted which extends to the level of the splenic hilum. A 3.0 cm x 2.4 cm pocket of inferolateral perisplenic fluid is noted. Adrenals/Urinary Tract: Adrenal glands are unremarkable. Kidneys are normal, without renal calculi, focal lesion, or hydronephrosis. Bladder is unremarkable. Stomach/Bowel: Stomach is within normal limits. Appendix appears normal. No evidence of bowel wall thickening, distention, or inflammatory changes. Vascular/Lymphatic: There is moderate severity  calcification of the abdominal aorta, without evidence of aneurysmal dilatation. No enlarged abdominal or pelvic lymph nodes. Reproductive: Uterus and bilateral adnexa are unremarkable. Other: No abdominal wall hernia or abnormality. A small amount of pelvic free fluid is seen (approximately 59.04 Hounsfield units). Musculoskeletal: Nondisplaced anterolateral fifth and sixth left rib fractures are seen. IMPRESSION: 1. Findings consistent with a grade 3 splenic laceration, as described above. 2. Nondisplaced anterolateral fifth and sixth left  rib fractures. 3. Mild to moderate severity left basilar atelectasis and/or infiltrate. 4. Small left pleural effusion. 5. Small amount of perihepatic and pelvic free fluid. 6. Fatty liver parenchyma. 7. Moderate severity calcification of the abdominal aorta. Aortic Atherosclerosis (ICD10-I70.0). Electronically Signed   By: Aram Candela M.D.   On: 02/27/2020 18:46   DG Shoulder Left  Result Date: 02/27/2020 CLINICAL DATA:  Fall EXAM: LEFT SHOULDER - 2+ VIEW COMPARISON:  None. FINDINGS: There is no evidence of fracture or dislocation. There is no evidence of arthropathy or other focal bone abnormality. Soft tissues are unremarkable. IMPRESSION: Negative. Electronically Signed   By: Jonna Clark M.D.   On: 02/27/2020 17:41   DG Hip Unilat With Pelvis 2-3 Views Left  Result Date: 02/27/2020 CLINICAL DATA:  Fall EXAM: DG HIP (WITH OR WITHOUT PELVIS) 2-3V LEFT COMPARISON:  None. FINDINGS: There is no evidence of hip fracture or dislocation. There is no evidence of arthropathy or other focal bone abnormality. IMPRESSION: No displaced hip or pelvic fracture. Please note that plain radiographs are insensitive for hip and pelvic fracture. Recommend CT or MRI to more sensitively evaluate if there is high clinical suspicion. Electronically Signed   By: Lauralyn Primes M.D.   On: 02/27/2020 17:44    Procedures Procedures (including critical care time)  Medications Ordered in  ED Medications  HYDROmorphone (DILAUDID) injection 0.5 mg (0.5 mg Intravenous Refused 02/27/20 1938)  sodium chloride 0.9 % bolus 1,000 mL (0 mLs Intravenous Stopped 02/27/20 1800)  iohexol (OMNIPAQUE) 350 MG/ML injection 100 mL (100 mLs Intravenous Contrast Given 02/27/20 1749)    ED Course  I have reviewed the triage vital signs and the nursing notes.  Pertinent labs & imaging results that were available during my care of the patient were reviewed by me and considered in my medical decision making (see chart for details).    MDM Rules/Calculators/A&P                         MERINDA VICTORINO is a 74 y.o. female with past medical history of diabetes, hyperlipidemia that presents the emergency department today for near syncope via EMS.  Unsure why patient  Had near syncopy at this time, however hemoglobin is 8.4, no baseline to compare.  Will perform digital rectal exam and plain films at this time.  Distal rectal exam with Hemoccult negative.  Rib fractures present on x-rays.  Will obtain trauma scans at this time.  CT abdomen pelvis with grade 3 splenic laceration, will call general surgery at this time.  645 Spoke to Dr. Carolynne Edouard, general surgery who will come see the patient. 715 Dr. Carolynne Edouard will admit the patient   The patient appears reasonably stabilized for admission considering the current resources, flow, and capabilities available in the ED at this time, and I doubt any other [facility] requiring further screening and/or treatment in the ED prior to admission.  I discussed this case with my attending physician who cosigned this note including patient's presenting symptoms, physical exam, and planned diagnostics and interventions. Attending physician stated agreement with plan or made changes to plan which were implemented.   Attending physician assessed patient at bedside.  Final Clinical Impression(s) / ED Diagnoses Final diagnoses:  Laceration of spleen, initial encounter  Closed  fracture of multiple ribs of left side, initial encounter    Rx / DC Orders ED Discharge Orders    None       Farrel Gordon, PA-C 02/27/20  2004    Charlynne Pander, MD 03/02/20 [phone]

## 2020-02-27 NOTE — ED Notes (Signed)
Report called to [facility] °

## 2020-02-27 NOTE — ED Notes (Signed)
[facility] present to get pt 

## 2020-02-28 LAB — BASIC METABOLIC PANEL
Anion gap: 9 (ref 5–15)
BUN: 15 mg/dL (ref 8–23)
CO2: 25 mmol/L (ref 22–32)
Calcium: 7.6 mg/dL — ABNORMAL LOW (ref 8.9–10.3)
Chloride: 106 mmol/L (ref 98–111)
Creatinine, Ser: 0.7 mg/dL (ref 0.44–1.00)
GFR calc Af Amer: >60 mL/min (ref >60)
GFR calc non Af Amer: >60 mL/min (ref >60)
Glucose, Bld: 146 mg/dL — ABNORMAL HIGH (ref 70–99)
Potassium: 3.6 mmol/L (ref 3.5–5.1)
Sodium: 140 mmol/L (ref 135–145)

## 2020-02-28 LAB — CBC
HCT: 25.8 % — ABNORMAL LOW (ref 36.0–46.0)
HCT: 23.8 % — ABNORMAL LOW (ref 36.0–46.0)
HCT: 23.9 % — ABNORMAL LOW (ref 36.0–46.0)
Hemoglobin: 8.5 g/dL — ABNORMAL LOW (ref 12.0–15.0)
Hemoglobin: 7.9 g/dL — ABNORMAL LOW (ref 12.0–15.0)
Hemoglobin: 8.2 g/dL — ABNORMAL LOW (ref 12.0–15.0)
MCH: 31.4 pg (ref 26.0–34.0)
MCH: 31.3 pg (ref 26.0–34.0)
MCH: 32.9 pg (ref 26.0–34.0)
MCHC: 32.9 g/dL (ref 30.0–36.0)
MCHC: 33.2 g/dL (ref 30.0–36.0)
MCHC: 34.3 g/dL (ref 30.0–36.0)
MCV: 95.2 fL (ref 80.0–100.0)
MCV: 94.4 fL (ref 80.0–100.0)
MCV: 96 fL (ref 80.0–100.0)
Platelets: 118 10*3/uL — ABNORMAL LOW (ref 150–400)
Platelets: 117 10*3/uL — ABNORMAL LOW (ref 150–400)
Platelets: 125 10*3/uL — ABNORMAL LOW (ref 150–400)
RBC: 2.71 MIL/uL — ABNORMAL LOW (ref 3.87–5.11)
RBC: 2.52 MIL/uL — ABNORMAL LOW (ref 3.87–5.11)
RBC: 2.49 MIL/uL — ABNORMAL LOW (ref 3.87–5.11)
RDW: 13.1 % (ref 11.5–15.5)
RDW: 13.2 % (ref 11.5–15.5)
RDW: 13.3 % (ref 11.5–15.5)
WBC: 9.3 10*3/uL (ref 4.0–10.5)
WBC: 10.2 10*3/uL (ref 4.0–10.5)
WBC: 10.4 10*3/uL (ref 4.0–10.5)
nRBC: 0 % (ref 0.0–0.2)
nRBC: 0 % (ref 0.0–0.2)
nRBC: 0 % (ref 0.0–0.2)

## 2020-02-28 LAB — GLUCOSE, CAPILLARY
Glucose-Capillary: 104 mg/dL — ABNORMAL HIGH (ref 70–99)
Glucose-Capillary: 120 mg/dL — ABNORMAL HIGH (ref 70–99)
Glucose-Capillary: 123 mg/dL — ABNORMAL HIGH (ref 70–99)
Glucose-Capillary: 135 mg/dL — ABNORMAL HIGH (ref 70–99)
Glucose-Capillary: 161 mg/dL — ABNORMAL HIGH (ref 70–99)
Glucose-Capillary: 212 mg/dL — ABNORMAL HIGH (ref 70–99)

## 2020-02-28 LAB — ABO/RH: ABO/RH(D): O NEG

## 2020-02-28 LAB — PREPARE RBC (CROSSMATCH)

## 2020-02-28 MED ORDER — SODIUM CHLORIDE 0.9% IV SOLUTION: Freq: Once | INTRAVENOUS | Status: DC

## 2020-02-28 MED ORDER — INFLUENZA VAC A&B SA ADJ QUAD 0.5 ML IM PRSY
0.5000 mL | PREFILLED_SYRINGE | INTRAMUSCULAR | Status: DC
  Filled 2020-02-28: qty 0.5

## 2020-02-28 MED ORDER — TRAMADOL HCL 50 MG PO TABS
50.0000 mg | ORAL_TABLET | Freq: Four times a day (QID) | ORAL | Status: DC | PRN
  Administered 2020-02-29 – 2020-03-03 (×2): 50 mg via ORAL
  Filled 2020-02-28 (×3): qty 1

## 2020-02-28 MED ORDER — CHLORHEXIDINE GLUCONATE CLOTH 2 % EX PADS
6.0000 | MEDICATED_PAD | Freq: Every day | CUTANEOUS | Status: DC
  Administered 2020-02-28 – 2020-03-03 (×4): 6 via TOPICAL

## 2020-02-28 MED ORDER — OXYCODONE-ACETAMINOPHEN 5-325 MG PO TABS
1.0000 | ORAL_TABLET | ORAL | Status: DC | PRN
  Administered 2020-02-28 – 2020-02-29 (×2): 1 via ORAL
  Filled 2020-02-28 (×2): qty 1

## 2020-02-28 MED ORDER — INSULIN ASPART 100 UNIT/ML ~~LOC~~ SOLN
0.0000 [IU] | Freq: Three times a day (TID) | SUBCUTANEOUS | Status: DC
  Administered 2020-02-28 (×2): 2 [IU] via SUBCUTANEOUS
  Administered 2020-02-29: 3 [IU] via SUBCUTANEOUS
  Administered 2020-03-01: 5 [IU] via SUBCUTANEOUS
  Administered 2020-03-01 (×2): 3 [IU] via SUBCUTANEOUS
  Administered 2020-03-02: 8 [IU] via SUBCUTANEOUS
  Administered 2020-03-02 (×2): 3 [IU] via SUBCUTANEOUS
  Administered 2020-03-03: 5 [IU] via SUBCUTANEOUS

## 2020-02-28 NOTE — Plan of Care (Signed)

## 2020-02-28 NOTE — Progress Notes (Signed)
Subjective/Chief Complaint: Pt doing well this AM 1U Trx PRBC overnight    Objective: Vital signs in last 24 hours: Temp:  [97.4 F (36.3 C)-98.8 F (37.1 C)] 97.4 F (36.3 C) (09/20 0416) Pulse Rate:  [60-110] 62 (09/20 0600) Resp:  [12-28] 12 (09/20 0600) BP: (99-199)/(49-91) 101/49 (09/20 0600) SpO2:  [93 %-99 %] 95 % (09/20 0600) Weight:  [51.3 kg] 51.3 kg (09/19 1637) Last BM Date: 02/26/20  Intake/Output from previous day: 09/19 0701 - 09/20 0700 In: 1066.5 [I.V.:751.5; Blood:315] Out: 900 [Urine:900] Intake/Output this shift: No intake/output data recorded.  PE:  Constitutional: No acute distress, conversant, appears states age. Eyes: Anicteric sclerae, moist conjunctiva, no lid lag Lungs: Clear to auscultation bilaterally, normal respiratory effort, TTP L CW CV: regular rate and rhythm, no murmurs, no peripheral edema, pedal pulses 2+ GI: Soft, no masses or hepatosplenomegaly, non-tender to palpation Skin: No rashes, palpation reveals normal turgor Psychiatric: appropriate judgment and insight, oriented to person, place, and time   Lab Results:  Recent Labs    02/27/20 1625 02/27/20 2123  WBC 14.7* 10.5  HGB 8.4* 7.0*  HCT 25.1* 21.3*  PLT 127* 132*   BMET Recent Labs    02/27/20 1625  NA 138  K 4.7  CL 104  CO2 26  GLUCOSE 220*  BUN 23  CREATININE 0.83  CALCIUM 8.0*   PT/INR No results for input(s): LABPROT, INR in the last 72 hours. ABG No results for input(s): PHART, HCO3 in the last 72 hours.  Invalid input(s): PCO2, PO2  Studies/Results: DG Ribs Unilateral W/Chest Left  Result Date: 02/27/2020 CLINICAL DATA:  Fall EXAM: LEFT RIBS AND CHEST - 3+ VIEW COMPARISON:  None. FINDINGS: There is minimally displaced fractures of the lateral left sixth, seventh, and ninth ribs. No pneumothorax is noted. No focal airspace consolidation. There is no evidence of pneumothorax or pleural effusion. Both lungs are clear. There is mild  cardiomegaly. Aortic knob calcifications are seen. IMPRESSION: Mildly displaced lateral sixth, seventh, and ninth left rib fractures. Electronically Signed   By: Jonna Clark M.D.   On: 02/27/2020 17:42   DG Lumbar Spine Complete  Result Date: 02/27/2020 CLINICAL DATA:  Patient fell 2 days ago and then again today.  Pain. EXAM: LUMBAR SPINE - COMPLETE 4+ VIEW COMPARISON:  None. FINDINGS: Mild multilevel degenerative disc disease. No fracture or traumatic malalignment. Minimal calcified atherosclerosis in the abdominal aorta. IMPRESSION: 1. No fracture or traumatic malalignment. 2. Mild degenerative changes. 3. Mild calcified atherosclerosis in the abdominal aorta. Electronically Signed   By: Gerome Sam III M.D   On: 02/27/2020 17:44   DG Elbow Complete Left  Result Date: 02/27/2020 CLINICAL DATA:  Fall EXAM: LEFT ELBOW - COMPLETE 3+ VIEW COMPARISON:  None. FINDINGS: There is no evidence of fracture, dislocation, or joint effusion. There is no evidence of arthropathy or other focal bone abnormality. Soft tissues are unremarkable. IMPRESSION: Negative. Electronically Signed   By: Jonna Clark M.D.   On: 02/27/2020 17:40   CT Head Wo Contrast  Result Date: 02/27/2020 CLINICAL DATA:  Nonspecific dizziness.  Two falls in the past week. EXAM: CT HEAD WITHOUT CONTRAST TECHNIQUE: Contiguous axial images were obtained from the base of the skull through the vertex without intravenous contrast. COMPARISON:  None. FINDINGS: Brain: No evidence of acute infarction, hemorrhage, hydrocephalus, extra-axial collection or mass lesion/mass effect. Vascular: No hyperdense vessel or unexpected calcification. Skull: Normal. Negative for fracture or focal lesion. Sinuses/Orbits: No acute finding. Other: None. IMPRESSION: No  acute intracranial abnormalities. No cause for the patient's symptoms identified. Electronically Signed   By: Gerome Sam III M.D   On: 02/27/2020 18:37   CT Angio Chest PE W and/or Wo  Contrast  Addendum Date: 02/27/2020   ADDENDUM REPORT: 02/27/2020 20:49 ADDENDUM: Results were discussed with PA Farrel Gordon at 6:17 p.m. [place] on February 27, 2020. Electronically Signed   By: Aram Candela M.D.   On: 02/27/2020 20:49   Result Date: 02/27/2020 CLINICAL DATA:  Multiple falls x1 week. EXAM: CT ANGIOGRAPHY CHEST WITH CONTRAST TECHNIQUE: Multidetector CT imaging of the chest was performed using the standard protocol during bolus administration of intravenous contrast. Multiplanar CT image reconstructions and MIPs were obtained to evaluate the vascular anatomy. CONTRAST:  OMNIPAQUE IOHEXOL 350 MG/ML SOLN COMPARISON:  None. FINDINGS: Cardiovascular: There is mild to moderate severity calcification of the aortic arch. Satisfactory opacification of the pulmonary arteries to the segmental level. No evidence of pulmonary embolism. Normal heart size. No pericardial effusion. Mediastinum/Nodes: No enlarged mediastinal, hilar, or axillary lymph nodes. Thyroid gland, trachea, and esophagus demonstrate no significant findings. Lungs/Pleura: Mild to moderate severity atelectasis and/or infiltrate is seen within the left lung base. There is a small left pleural effusion. No pneumothorax is identified. Upper Abdomen: There is a mild amount of nonhemorrhagic perihepatic fluid. A 3.2 cm by 2.7 cm irregular appearing area of parenchymal low attenuation is seen within the inferolateral aspect of the spleen. An adjacent 6.5 cm linear splenic laceration is also noted. This extends to the level of the splenic hilum. A 2.3 cm x 2.6 cm pocket of inferior perisplenic fluid is noted (axial CT image 90, CT series number 7). Musculoskeletal: Acute, nondisplaced lateral fifth and sixth left rib fractures are seen. Review of the MIP images confirms the above findings. IMPRESSION: 1. Grade 3 splenic laceration with a small pocket of inferolateral perisplenic fluid. 2. Acute fifth and sixth left rib fractures. 3.  Mild to moderate severity left basilar atelectasis and/or infiltrate. 4. Small left pleural effusion. 5. Small amount of nonhemorrhagic perihepatic fluid. Aortic Atherosclerosis (ICD10-I70.0). Electronically Signed: By: Aram Candela M.D. On: 02/27/2020 18:40   CT ABDOMEN PELVIS W CONTRAST  Addendum Date: 02/27/2020   ADDENDUM REPORT: 02/27/2020 20:49 ADDENDUM: Results were discussed with PA Farrel Gordon at 6:17 p.m. [place] on February 27, 2020. Electronically Signed   By: Aram Candela M.D.   On: 02/27/2020 20:49   Result Date: 02/27/2020 CLINICAL DATA:  Status post multiple falls x1 week. EXAM: CT ABDOMEN AND PELVIS WITH CONTRAST TECHNIQUE: Multidetector CT imaging of the abdomen and pelvis was performed using the standard protocol following bolus administration of intravenous contrast. CONTRAST:  OMNIPAQUE IOHEXOL 350 MG/ML SOLN COMPARISON:  None. FINDINGS: Lower chest: Mild to moderate severity atelectasis and/or infiltrate is seen within the left lung base. There is a small left pleural effusion. Hepatobiliary: There is diffuse fatty infiltration of the liver parenchyma. No focal liver abnormality is seen. A mild amount of peri hepatic fluid is noted (approximately 43.85 Hounsfield units). No gallstones, gallbladder wall thickening, or biliary dilatation. Pancreas: Unremarkable. No pancreatic ductal dilatation or surrounding inflammatory changes. Spleen: A 2.8 cm x 4.8 cm area of irregular appearing parenchymal low attenuation is seen within the posterolateral aspect of the spleen. An adjacent 6.5 cm curvilinear splenic laceration is noted which extends to the level of the splenic hilum. A 3.0 cm x 2.4 cm pocket of inferolateral perisplenic fluid is noted. Adrenals/Urinary Tract: Adrenal glands are unremarkable. Kidneys are  normal, without renal calculi, focal lesion, or hydronephrosis. Bladder is unremarkable. Stomach/Bowel: Stomach is within normal limits. Appendix appears normal. No  evidence of bowel wall thickening, distention, or inflammatory changes. Vascular/Lymphatic: There is moderate severity calcification of the abdominal aorta, without evidence of aneurysmal dilatation. No enlarged abdominal or pelvic lymph nodes. Reproductive: Uterus and bilateral adnexa are unremarkable. Other: No abdominal wall hernia or abnormality. A small amount of pelvic free fluid is seen (approximately 59.04 Hounsfield units). Musculoskeletal: Nondisplaced anterolateral fifth and sixth left rib fractures are seen. IMPRESSION: 1. Findings consistent with a grade 3 splenic laceration, as described above. 2. Nondisplaced anterolateral fifth and sixth left rib fractures. 3. Mild to moderate severity left basilar atelectasis and/or infiltrate. 4. Small left pleural effusion. 5. Small amount of perihepatic and pelvic free fluid. 6. Fatty liver parenchyma. 7. Moderate severity calcification of the abdominal aorta. Aortic Atherosclerosis (ICD10-I70.0). Electronically Signed: By: Aram Candela M.D. On: 02/27/2020 18:46   DG Shoulder Left  Result Date: 02/27/2020 CLINICAL DATA:  Fall EXAM: LEFT SHOULDER - 2+ VIEW COMPARISON:  None. FINDINGS: There is no evidence of fracture or dislocation. There is no evidence of arthropathy or other focal bone abnormality. Soft tissues are unremarkable. IMPRESSION: Negative. Electronically Signed   By: Jonna Clark M.D.   On: 02/27/2020 17:41   DG Hip Unilat With Pelvis 2-3 Views Left  Result Date: 02/27/2020 CLINICAL DATA:  Fall EXAM: DG HIP (WITH OR WITHOUT PELVIS) 2-3V LEFT COMPARISON:  None. FINDINGS: There is no evidence of hip fracture or dislocation. There is no evidence of arthropathy or other focal bone abnormality. IMPRESSION: No displaced hip or pelvic fracture. Please note that plain radiographs are insensitive for hip and pelvic fracture. Recommend CT or MRI to more sensitively evaluate if there is high clinical suspicion. Electronically Signed   By: Lauralyn Primes M.D.   On: 02/27/2020 17:44    Assessment/Plan: 63F s/p Fall Grade III splenic lac- repeat CBC post trx-pending, bedrest L rib fx 5-6-Pulm toilet, PO pain control Small Hemoperitoneum  FEN- ADAT, decrease  IVF  Dispo: ICU for now.  Possible PCU later this PM if tol PO and CBC stable   LOS: 1 day    Axel Filler 02/28/2020

## 2020-02-29 ENCOUNTER — Inpatient Hospital Stay (HOSPITAL_COMMUNITY): Payer: Medicare PPO

## 2020-02-29 ENCOUNTER — Encounter (HOSPITAL_COMMUNITY): Payer: Medicare PPO

## 2020-02-29 DIAGNOSIS — I371 Nonrheumatic pulmonary valve insufficiency: Secondary | ICD-10-CM

## 2020-02-29 DIAGNOSIS — R55 Syncope and collapse: Secondary | ICD-10-CM

## 2020-02-29 LAB — CBC
HCT: 22.3 % — ABNORMAL LOW (ref 36.0–46.0)
HCT: 24.1 % — ABNORMAL LOW (ref 36.0–46.0)
HCT: 25.8 % — ABNORMAL LOW (ref 36.0–46.0)
Hemoglobin: 7.4 g/dL — ABNORMAL LOW (ref 12.0–15.0)
Hemoglobin: 7.8 g/dL — ABNORMAL LOW (ref 12.0–15.0)
Hemoglobin: 8.3 g/dL — ABNORMAL LOW (ref 12.0–15.0)
MCH: 31.6 pg (ref 26.0–34.0)
MCH: 31.1 pg (ref 26.0–34.0)
MCH: 31 pg (ref 26.0–34.0)
MCHC: 33.2 g/dL (ref 30.0–36.0)
MCHC: 32.4 g/dL (ref 30.0–36.0)
MCHC: 32.2 g/dL (ref 30.0–36.0)
MCV: 95.3 fL (ref 80.0–100.0)
MCV: 96 fL (ref 80.0–100.0)
MCV: 96.3 fL (ref 80.0–100.0)
Platelets: 119 10*3/uL — ABNORMAL LOW (ref 150–400)
Platelets: 122 10*3/uL — ABNORMAL LOW (ref 150–400)
Platelets: 141 10*3/uL — ABNORMAL LOW (ref 150–400)
RBC: 2.34 MIL/uL — ABNORMAL LOW (ref 3.87–5.11)
RBC: 2.51 MIL/uL — ABNORMAL LOW (ref 3.87–5.11)
RBC: 2.68 MIL/uL — ABNORMAL LOW (ref 3.87–5.11)
RDW: 13.3 % (ref 11.5–15.5)
RDW: 13.2 % (ref 11.5–15.5)
RDW: 13.2 % (ref 11.5–15.5)
WBC: 8.4 10*3/uL (ref 4.0–10.5)
WBC: 9.4 10*3/uL (ref 4.0–10.5)
WBC: 9.2 10*3/uL (ref 4.0–10.5)
nRBC: 0 % (ref 0.0–0.2)
nRBC: 0 % (ref 0.0–0.2)
nRBC: 0 % (ref 0.0–0.2)

## 2020-02-29 LAB — TYPE AND SCREEN
ABO/RH(D): O NEG
Antibody Screen: NEGATIVE
Unit division: 0

## 2020-02-29 LAB — BPAM RBC
Blood Product Expiration Date: 202109212359
ISSUE DATE / TIME: 202109200209
Unit Type and Rh: 9500

## 2020-02-29 LAB — GLUCOSE, CAPILLARY
Glucose-Capillary: 163 mg/dL — ABNORMAL HIGH (ref 70–99)
Glucose-Capillary: 191 mg/dL — ABNORMAL HIGH (ref 70–99)
Glucose-Capillary: 173 mg/dL — ABNORMAL HIGH (ref 70–99)
Glucose-Capillary: 157 mg/dL — ABNORMAL HIGH (ref 70–99)

## 2020-02-29 LAB — ECHOCARDIOGRAM COMPLETE BUBBLE STUDY
Area-P 1/2: 3.31 cm2
S' Lateral: 2.4 cm

## 2020-02-29 MED ORDER — FERROUS SULFATE 325 (65 FE) MG PO TABS
325.0000 mg | ORAL_TABLET | Freq: Three times a day (TID) | ORAL | Status: DC
  Administered 2020-02-29 – 2020-03-03 (×9): 325 mg via ORAL
  Filled 2020-02-29 (×9): qty 1

## 2020-02-29 MED ORDER — OXYCODONE-ACETAMINOPHEN 5-325 MG PO TABS: 1.0000 | ORAL_TABLET | ORAL | Status: DC | PRN

## 2020-02-29 MED ORDER — ASCORBIC ACID 500 MG PO TABS
500.0000 mg | ORAL_TABLET | Freq: Three times a day (TID) | ORAL | Status: DC
  Administered 2020-02-29 – 2020-03-03 (×9): 500 mg via ORAL
  Filled 2020-02-29 (×9): qty 1

## 2020-02-29 NOTE — Progress Notes (Signed)
Pt appetite very poor this shift. Pt vomited before eating breakfast. Zofran was administered and effective, however, pt still not wanting to eat. Pt maybe ate 2 green beans. Pt did later eat crackers and cheese as well as a Glucerna that was partially finished. Pt reported discomfort related to voiding the bladder. Bladder scanner showed 980 ml. In and out cath done and 950 ml of urine removed. Pt tolerated well. Mayford Knife RN

## 2020-02-29 NOTE — Progress Notes (Signed)
0200 Noted that this patient's SPO2 had dip down to the 80's while sleeping. Pt. Placed on O2 @2LPM .   Current SPO2 @ 99% on 2lpm

## 2020-02-29 NOTE — Progress Notes (Signed)
Trauma/Critical Care Follow Up Note  Subjective:    Overnight Issues:   Objective:  Vital signs for last 24 hours: Temp:  [98 F (36.7 C)-99 F (37.2 C)] 98 F (36.7 C) (09/21 0744) Pulse Rate:  [59-85] 66 (09/21 0744) Resp:  [14-22] 17 (09/21 0744) BP: (108-130)/(47-96) 130/52 (09/21 0744) SpO2:  [90 %-99 %] 91 % (09/21 0744)  Hemodynamic parameters for last 24 hours:    Intake/Output from previous day: 09/20 0701 - 09/21 0700 In: 1360.8 [P.O.:200; I.V.:1160.8] Out: -   Intake/Output this shift: No intake/output data recorded.  Vent settings for last 24 hours:    Physical Exam:  Gen: comfortable, no distress Neuro: non-focal exam HEENT: PERRL Neck: supple CV: RRR Pulm: unlabored breathing, but sats 89% on RA, added 2L [place] Abd: soft, mildly tender and mildly distended GU: clear yellow urine Extr: wwp, no edema   Results for orders placed or performed during the hospital encounter of 02/27/20 (from the past 24 hour(s))  Glucose, capillary     Status: Abnormal   Collection Time: 02/28/20 11:42 AM  Result Value Ref Range   Glucose-Capillary 123 (H) 70 - 99 mg/dL  CBC     Status: Abnormal   Collection Time: 02/28/20  2:50 PM  Result Value Ref Range   WBC 10.2 4.0 - 10.5 K/uL   RBC 2.52 (L) 3.87 - 5.11 MIL/uL   Hemoglobin 7.9 (L) 12.0 - 15.0 g/dL   HCT 83.6 (L) 36 - 46 %   MCV 94.4 80.0 - 100.0 fL   MCH 31.3 26.0 - 34.0 pg   MCHC 33.2 30.0 - 36.0 g/dL   RDW 62.9 47.6 - 54.6 %   Platelets 117 (L) 150 - 400 K/uL   nRBC 0.0 0.0 - 0.2 %  Glucose, capillary     Status: Abnormal   Collection Time: 02/28/20  5:08 PM  Result Value Ref Range   Glucose-Capillary 135 (H) 70 - 99 mg/dL  CBC     Status: Abnormal   Collection Time: 02/28/20  9:16 PM  Result Value Ref Range   WBC 10.4 4.0 - 10.5 K/uL   RBC 2.49 (L) 3.87 - 5.11 MIL/uL   Hemoglobin 8.2 (L) 12.0 - 15.0 g/dL   HCT 50.3 (L) 36 - 46 %   MCV 96.0 80.0 - 100.0 fL   MCH 32.9 26.0 - 34.0 pg   MCHC 34.3  30.0 - 36.0 g/dL   RDW 54.6 56.8 - 12.7 %   Platelets 125 (L) 150 - 400 K/uL   nRBC 0.0 0.0 - 0.2 %  Glucose, capillary     Status: Abnormal   Collection Time: 02/28/20 10:27 PM  Result Value Ref Range   Glucose-Capillary 161 (H) 70 - 99 mg/dL  CBC     Status: Abnormal   Collection Time: 02/29/20  3:03 AM  Result Value Ref Range   WBC 8.4 4.0 - 10.5 K/uL   RBC 2.34 (L) 3.87 - 5.11 MIL/uL   Hemoglobin 7.4 (L) 12.0 - 15.0 g/dL   HCT 51.7 (L) 36 - 46 %   MCV 95.3 80.0 - 100.0 fL   MCH 31.6 26.0 - 34.0 pg   MCHC 33.2 30.0 - 36.0 g/dL   RDW 00.1 74.9 - 44.9 %   Platelets 119 (L) 150 - 400 K/uL   nRBC 0.0 0.0 - 0.2 %  Glucose, capillary     Status: Abnormal   Collection Time: 02/29/20  7:43 AM  Result Value Ref Range  Glucose-Capillary 163 (H) 70 - 99 mg/dL    Assessment & Plan: The plan of care was discussed with the bedside nurse for the day, who is in agreement with this plan and no additional concerns were raised.   Present on Admission: . Spleen laceration    LOS: 2 days   Additional comments:I reviewed the patient's new clinical lab test results.   and I reviewed the patients new imaging test results.    Fall  Grade III splenic lac with hemoperitoneum - hgb drifting down, repeat in AM, may need transfusion if symptomatic, bedrest x72h, come off 9/23 L rib fx 5-6 - Pulm toilet, PO pain control ? Syncopal fall, recurrent falls - carotid duplex and echo today FEN - CLD, cont  IVF DVT - SCDs, hold chemical PPX for now Dispo: SDU   Diamantina Monks, MD Trauma & General Surgery Please use [email or web address] to contact on call provider  02/29/2020  *Care during the described time interval was provided by me. I have reviewed this patient's available data, including medical history, events of note, physical examination and test results as part of my evaluation.

## 2020-02-29 NOTE — Progress Notes (Signed)
Echocardiogram 2D Echocardiogram has been performed.  Warren Lacy Kimberlea Schlag 02/29/2020, 3:18 PM

## 2020-03-01 LAB — CBC
HCT: 25.8 % — ABNORMAL LOW (ref 36.0–46.0)
Hemoglobin: 8.4 g/dL — ABNORMAL LOW (ref 12.0–15.0)
MCH: 31.1 pg (ref 26.0–34.0)
MCHC: 32.6 g/dL (ref 30.0–36.0)
MCV: 95.6 fL (ref 80.0–100.0)
Platelets: 134 10*3/uL — ABNORMAL LOW (ref 150–400)
RBC: 2.7 MIL/uL — ABNORMAL LOW (ref 3.87–5.11)
RDW: 12.9 % (ref 11.5–15.5)
WBC: 9.2 10*3/uL (ref 4.0–10.5)
nRBC: 0 % (ref 0.0–0.2)

## 2020-03-01 LAB — URINALYSIS, ROUTINE W REFLEX MICROSCOPIC
Bilirubin Urine: NEGATIVE
Glucose, UA: >=500 mg/dL — AB
Hgb urine dipstick: NEGATIVE
Ketones, ur: NEGATIVE mg/dL
Nitrite: POSITIVE — AB
Protein, ur: NEGATIVE mg/dL
Specific Gravity, Urine: 1.013 (ref 1.005–1.030)
pH: 5 (ref 5.0–8.0)

## 2020-03-01 LAB — GLUCOSE, CAPILLARY
Glucose-Capillary: 215 mg/dL — ABNORMAL HIGH (ref 70–99)
Glucose-Capillary: 155 mg/dL — ABNORMAL HIGH (ref 70–99)
Glucose-Capillary: 180 mg/dL — ABNORMAL HIGH (ref 70–99)
Glucose-Capillary: 195 mg/dL — ABNORMAL HIGH (ref 70–99)

## 2020-03-01 MED ORDER — MORPHINE SULFATE (PF) 2 MG/ML IV SOLN: 2.0000 mg | INTRAVENOUS | Status: DC | PRN

## 2020-03-01 MED ORDER — BETHANECHOL CHLORIDE 10 MG PO TABS
10.0000 mg | ORAL_TABLET | Freq: Three times a day (TID) | ORAL | Status: DC
  Administered 2020-03-01 – 2020-03-03 (×7): 10 mg via ORAL
  Filled 2020-03-01 (×7): qty 1

## 2020-03-01 MED ORDER — DOCUSATE SODIUM 100 MG PO CAPS
100.0000 mg | ORAL_CAPSULE | Freq: Two times a day (BID) | ORAL | Status: DC
  Administered 2020-03-01 – 2020-03-03 (×4): 100 mg via ORAL
  Filled 2020-03-01 (×5): qty 1

## 2020-03-01 MED ORDER — OXYCODONE HCL 5 MG PO TABS: 5.0000 mg | ORAL_TABLET | ORAL | Status: DC | PRN

## 2020-03-01 MED ORDER — ENOXAPARIN SODIUM 30 MG/0.3ML ~~LOC~~ SOLN
30.0000 mg | Freq: Two times a day (BID) | SUBCUTANEOUS | Status: DC
  Administered 2020-03-01 – 2020-03-03 (×2): 30 mg via SUBCUTANEOUS
  Filled 2020-03-01 (×4): qty 0.3

## 2020-03-01 MED ORDER — ACETAMINOPHEN 325 MG PO TABS: 650.0000 mg | ORAL_TABLET | Freq: Four times a day (QID) | ORAL | Status: DC | PRN

## 2020-03-01 MED ORDER — POLYETHYLENE GLYCOL 3350 17 G PO PACK
17.0000 g | PACK | Freq: Every day | ORAL | Status: DC
  Administered 2020-03-01 – 2020-03-02 (×2): 17 g via ORAL
  Filled 2020-03-01 (×2): qty 1

## 2020-03-01 NOTE — Progress Notes (Signed)
Pt has not voided on shift. Bladder scanner showed 400 @0130  and >490 @0530 . Two RN's attempted to insert in and out cath and was unsuccessful. Purulent drainage noted. On call provider notified. No new orders received.

## 2020-03-01 NOTE — Progress Notes (Signed)
Pt stated she felt like she had to pee but couldn't, stated she was very uncomfortable and asked me to try one more time to in an out cath her. I was able to in an out cath PT successfully; 900 ml urine out. Pt currently resting with eyes closed.

## 2020-03-01 NOTE — TOC CAGE-AID Note (Signed)
Transition of Care [facility]) - CAGE-AID Screening   Patient Details  Name: Norma Clark MRN: [id number] Date of Birth: 1946/02/20  [facility] CM/SW Contact:    Emeterio Reeve, [place] Phone Number: 03/01/2020, 3:21 PM   Clinical Narrative:  CSW met with pt at bedside. CSW introduced self and explained her role at the hospital.  Pt denies alcohol use and substance use. Pt did not need any resources at this time.    CAGE-AID Screening:    Have You Ever Felt You Ought to Cut Down on Your Drinking or Drug Use?: No Have People Annoyed You By Critizing Your Drinking Or Drug Use?: No Have You Felt Bad Or Guilty About Your Drinking Or Drug Use?: No Have You Ever Had a Drink or Used Drugs First Thing In The Morning to Steady Your Nerves or to Get Rid of a Hangover?: No CAGE-AID Score: 0  Substance Abuse Education Offered: Yes    Blima Ledger, [place] Social Worker [phone]

## 2020-03-01 NOTE — Progress Notes (Signed)
[facility] Progress Note     Subjective: CC-  Patient is doing well. She had episode of nausea and vomiting this morning and was given Zofran. Patient states nausea is much improved. Mild abdominal pain that is controlled with current pain medication. Patient is on 1 liter of oxygen that was placed last night due to sats in the upper 80s. Pulling 500 on IS. Mild pain when she coughs. Last BM was 9/19. Patient is not able to void. Nurse did in and out cath and got 900 cc of urine this morning. Patient also received an I&O cath yesterday with 900 cc of urine output as well. Denies SOB, chest pain  Objective: Vital signs in last 24 hours: Temp:  [98.2 F (36.8 C)-98.9 F (37.2 C)] 98.3 F (36.8 C) (09/22 0737) Pulse Rate:  [65-91] 91 (09/22 0737) Resp:  [13-20] 19 (09/22 0737) BP: (113-135)/(50-86) 135/69 (09/22 0737) SpO2:  [93 %-100 %] 93 % (09/22 0737) Last BM Date: 02/27/20  Intake/Output from previous day: 09/21 0701 - 09/22 0700 In: -  Out: 950 [Urine:950] Intake/Output this shift: Total I/O In: 240 [P.O.:240] Out: 900 [Urine:900]  PE: Gen:  Alert, NAD, pleasant HEENT: EOM's intact, pupils equal and round Card:  RRR Pulm:  CTAB, no W/R/R, rate and effort normal on 1L  Abd: Soft, mild distension, tender to palpation in the LUQ without rebound or guarding, +BS Ext:  no BUE/BLE edema, calves soft and nontender Psych: A&Ox4 Skin: no rashes noted, warm and dry  Lab Results:  Recent Labs    02/29/20 0926 02/29/20 1420  WBC 9.4 9.2  HGB 7.8* 8.3*  HCT 24.1* 25.8*  PLT 122* 141*   BMET Recent Labs    02/27/20 1625 02/28/20 0827  NA 138 140  K 4.7 3.6  CL 104 106  CO2 26 25  GLUCOSE 220* 146*  BUN 23 15  CREATININE 0.83 0.70  CALCIUM 8.0* 7.6*   PT/INR No results for input(s): LABPROT, INR in the last 72 hours. CMP     Component Value Date/Time   NA 140 02/28/2020 0827   K 3.6 02/28/2020 0827   CL 106 02/28/2020 0827   CO2 25  02/28/2020 0827   GLUCOSE 146 (H) 02/28/2020 0827   BUN 15 02/28/2020 0827   CREATININE 0.70 02/28/2020 0827   CALCIUM 7.6 (L) 02/28/2020 0827   PROT 6.7 02/27/2020 1625   ALBUMIN 3.6 02/27/2020 1625   AST 23 02/27/2020 1625   ALT 19 02/27/2020 1625   ALKPHOS 66 02/27/2020 1625   BILITOT 0.5 02/27/2020 1625   GFRNONAA >60 02/28/2020 0827   GFRAA >60 02/28/2020 0827   Lipase  No results found for: LIPASE     Studies/Results: ECHOCARDIOGRAM COMPLETE BUBBLE STUDY  Result Date: 02/29/2020    ECHOCARDIOGRAM REPORT   Patient Name:   LAKRESHA STIFTER Date of Exam: 02/29/2020 Medical Rec #:  [id number]          Height:       62.0 in Accession #:    [id number]         Weight:       113.0 lb Date of Birth:  04-11-1946         BSA:          1.500 m Patient Age:    74 years           BP:           113/55 mmHg Patient Gender: F  HR:           78 bpm. Exam Location:  Inpatient Procedure: 2D Echo, Color Doppler, Cardiac Doppler and Saline Contrast Bubble            Study Indications:    R55 Syncope  History:        Patient has no prior history of Echocardiogram examinations.                 Risk Factors:Diabetes and Dyslipidemia.  Sonographer:    Irving Burton Senior RDCS Referring Phys: [id number] Diamantina Monks IMPRESSIONS  1. Left ventricular ejection fraction, by estimation, is 60 to 65%. The left ventricle has normal function. The left ventricle has no regional wall motion abnormalities. Left ventricular diastolic parameters were normal.  2. Right ventricular systolic function is normal. The right ventricular size is normal. There is normal pulmonary artery systolic pressure.  3. A small pericardial effusion is present. The pericardial effusion is circumferential.  4. The mitral valve is normal in structure. No evidence of mitral valve regurgitation. No evidence of mitral stenosis.  5. The aortic valve is normal in structure. Aortic valve regurgitation is not visualized. Mild aortic valve  sclerosis is present, with no evidence of aortic valve stenosis.  6. The inferior vena cava is normal in size with greater than 50% respiratory variability, suggesting right atrial pressure of 3 mmHg.  7. Agitated saline contrast bubble study was negative, with no evidence of any interatrial shunt. FINDINGS  Left Ventricle: Left ventricular ejection fraction, by estimation, is 60 to 65%. The left ventricle has normal function. The left ventricle has no regional wall motion abnormalities. The left ventricular internal cavity size was normal in size. There is  no left ventricular hypertrophy. Left ventricular diastolic parameters were normal. Normal left ventricular filling pressure. Right Ventricle: The right ventricular size is normal. No increase in right ventricular wall thickness. Right ventricular systolic function is normal. There is normal pulmonary artery systolic pressure. The tricuspid regurgitant velocity is 2.02 m/s, and  with an assumed right atrial pressure of 8 mmHg, the estimated right ventricular systolic pressure is 24.3 mmHg. Left Atrium: Left atrial size was normal in size. Right Atrium: Right atrial size was normal in size. Pericardium: A small pericardial effusion is present. The pericardial effusion is circumferential. Mitral Valve: The mitral valve is normal in structure. No evidence of mitral valve regurgitation. No evidence of mitral valve stenosis. Tricuspid Valve: The tricuspid valve is normal in structure. Tricuspid valve regurgitation is not demonstrated. No evidence of tricuspid stenosis. Aortic Valve: The aortic valve is normal in structure. Aortic valve regurgitation is not visualized. Mild aortic valve sclerosis is present, with no evidence of aortic valve stenosis. Pulmonic Valve: The pulmonic valve was normal in structure. Pulmonic valve regurgitation is mild. No evidence of pulmonic stenosis. Aorta: The aortic root is normal in size and structure. Venous: The inferior vena cava is  normal in size with greater than 50% respiratory variability, suggesting right atrial pressure of 3 mmHg. IAS/Shunts: No atrial level shunt detected by color flow Doppler. Agitated saline contrast was given intravenously to evaluate for intracardiac shunting. Agitated saline contrast bubble study was negative, with no evidence of any interatrial shunt.  LEFT VENTRICLE PLAX 2D LVIDd:         3.80 cm  Diastology LVIDs:         2.40 cm  LV e' medial:    7.62 cm/s LV PW:         0.70  cm  LV E/e' medial:  13.0 LV IVS:        0.70 cm  LV e' lateral:   8.16 cm/s LVOT diam:     1.80 cm  LV E/e' lateral: 12.1 LV SV:         63 LV SV Index:   42 LVOT Area:     2.54 cm  RIGHT VENTRICLE RV S prime:     13.20 cm/s TAPSE (M-mode): 2.2 cm LEFT ATRIUM             Index       RIGHT ATRIUM           Index LA diam:        3.50 cm 2.33 cm/m  RA Area:     12.00 cm LA Vol (A2C):   39.3 ml 26.20 ml/m RA Volume:   24.60 ml  16.40 ml/m LA Vol (A4C):   48.5 ml 32.34 ml/m LA Biplane Vol: 47.2 ml 31.47 ml/m  AORTIC VALVE LVOT Vmax:   109.00 cm/s LVOT Vmean:  71.900 cm/s LVOT VTI:    0.247 m  AORTA Ao Root diam: 2.80 cm Ao Asc diam:  2.90 cm MITRAL VALVE                TRICUSPID VALVE MV Area (PHT): 3.31 cm     TR Peak grad:   16.3 mmHg MV Decel Time: 229 msec     TR Vmax:        202.00 cm/s MV E velocity: 98.80 cm/s MV A velocity: 120.00 cm/s  SHUNTS MV E/A ratio:  0.82         Systemic VTI:  0.25 m                             Systemic Diam: 1.80 cm Rachelle Hora Croitoru MD Electronically signed by Thurmon Fair MD Signature Date/Time: 02/29/2020/4:21:17 PM    Final    VAS US CAROTID  Result Date: 02/29/2020 Carotid Arterial Duplex Study Indications:       Syncope. Comparison Study:  No prior studies. Performing Technologist: Jean Rosenthal  Examination Guidelines: A complete evaluation includes B-mode imaging, spectral Doppler, color Doppler, and power Doppler as needed of all accessible portions of each vessel. Bilateral testing is  considered an integral part of a complete examination. Limited examinations for reoccurring indications may be performed as noted.  Right Carotid Findings: +----------+--------+--------+--------+------------------+------------------+           PSV cm/sEDV cm/sStenosisPlaque DescriptionComments           +----------+--------+--------+--------+------------------+------------------+ CCA Prox  111     37                                intimal thickening +----------+--------+--------+--------+------------------+------------------+ CCA Distal111     31                                                   +----------+--------+--------+--------+------------------+------------------+ ICA Prox  126     43      1-39%   calcific and focal                   +----------+--------+--------+--------+------------------+------------------+ ICA Distal97      37                                                   +----------+--------+--------+--------+------------------+------------------+  ECA       137     29                                                   +----------+--------+--------+--------+------------------+------------------+ +----------+--------+-------+----------------+-------------------+           PSV cm/sEDV cmsDescribe        Arm Pressure (mmHG) +----------+--------+-------+----------------+-------------------+ [id number]            Multiphasic, WNL                    +----------+--------+-------+----------------+-------------------+ +---------+--------+--+--------+--+---------+ VertebralPSV cm/s42EDV cm/s13Antegrade +---------+--------+--+--------+--+---------+  Left Carotid Findings: +----------+--------+--------+--------+------------------+------------------+           PSV cm/sEDV cm/sStenosisPlaque DescriptionComments           +----------+--------+--------+--------+------------------+------------------+ CCA Prox  135     28                                 intimal thickening +----------+--------+--------+--------+------------------+------------------+ CCA Distal120     31                                intimal thickening +----------+--------+--------+--------+------------------+------------------+ ICA Prox  129     30      1-39%   heterogenous                         +----------+--------+--------+--------+------------------+------------------+ ICA Distal129     48                                                   +----------+--------+--------+--------+------------------+------------------+ ECA       154     20                                                   +----------+--------+--------+--------+------------------+------------------+ +----------+--------+--------+----------------+-------------------+           PSV cm/sEDV cm/sDescribe        Arm Pressure (mmHG) +----------+--------+--------+----------------+-------------------+ Subclavian213             Multiphasic, WNL                    +----------+--------+--------+----------------+-------------------+ +---------+--------+--+--------+--+---------+ VertebralPSV cm/s91EDV cm/s37Antegrade +---------+--------+--+--------+--+---------+   Summary: Right Carotid: Velocities in the right ICA are consistent with a 1-39% stenosis. Left Carotid: Velocities in the left ICA are consistent with a 1-39% stenosis. Vertebrals:  Bilateral vertebral arteries demonstrate antegrade flow. Subclavians: Normal flow hemodynamics were seen in bilateral subclavian              arteries. *See table(s) above for measurements and observations.  Electronically signed by Waverly Ferrari MD on 02/29/2020 at 4:44:30 PM.    Final     Anti-infectives: Anti-infectives (From admission, onward)   None       Assessment/Plan Fall Grade III splenic lac with hemoperitoneum -CBC pending. Started iron and vitamin C 9/21. bedrest x72h, come off 9/23 L rib fx 5-6 -  Pulm toilet, IS, PO  pain control ? Syncopal fall, recurrent falls - carotid duplex and echo ok, may need outpatient holter monitor - will discuss with cardiology FEN -CM diet, glucerna, IVF Urinary retention - start Urecholine and replace foley; check UA and Ucx DVT - SCDs, hold chemical PPX for nowuntil hgb stable Dispo: Labs pending. Place foley and start urecholine. Continue bedrest today, plan to start therapies tomorrow.   LOS: 3 days    Pia MauJohn Payne, [facility] 03/01/2020, 10:57 AM Please see Amion for pager number during day hours 7:00am-4:30pm

## 2020-03-02 LAB — GLUCOSE, CAPILLARY
Glucose-Capillary: 143 mg/dL — ABNORMAL HIGH (ref 70–99)
Glucose-Capillary: 162 mg/dL — ABNORMAL HIGH (ref 70–99)
Glucose-Capillary: 272 mg/dL — ABNORMAL HIGH (ref 70–99)
Glucose-Capillary: 178 mg/dL — ABNORMAL HIGH (ref 70–99)
Glucose-Capillary: 259 mg/dL — ABNORMAL HIGH (ref 70–99)

## 2020-03-02 LAB — CBC
HCT: 27.2 % — ABNORMAL LOW (ref 36.0–46.0)
Hemoglobin: 8.8 g/dL — ABNORMAL LOW (ref 12.0–15.0)
MCH: 31.4 pg (ref 26.0–34.0)
MCHC: 32.4 g/dL (ref 30.0–36.0)
MCV: 97.1 fL (ref 80.0–100.0)
Platelets: 147 10*3/uL — ABNORMAL LOW (ref 150–400)
RBC: 2.8 MIL/uL — ABNORMAL LOW (ref 3.87–5.11)
RDW: 12.8 % (ref 11.5–15.5)
WBC: 9.3 10*3/uL (ref 4.0–10.5)
nRBC: 0 % (ref 0.0–0.2)

## 2020-03-02 MED ORDER — LISINOPRIL 5 MG PO TABS
5.0000 mg | ORAL_TABLET | Freq: Every day | ORAL | Status: DC
  Administered 2020-03-03: 5 mg via ORAL
  Filled 2020-03-02: qty 1

## 2020-03-02 MED ORDER — POLYETHYLENE GLYCOL 3350 17 G PO PACK
17.0000 g | PACK | Freq: Two times a day (BID) | ORAL | Status: DC
  Filled 2020-03-02: qty 1

## 2020-03-02 NOTE — Evaluation (Signed)
Physical Therapy Evaluation Patient Details Name: Norma Clark MRN: [id number] DOB: 1945/11/17 Today's Date: 03/02/2020   History of Present Illness  The patient is a 74 year old white female who presents after standing up and getting lightheaded and falling down a couple of stairs at home today. She complains of some left-sided chest pain and some abdominal pain. She states that she also fell a week ago on the left side as well and at that time she felt like she broke some ribs. Found to have grade III splenic lac with hemoperitoneum, L rib fx 5-6. PHMx:diabetes, hyperlipidemia  Clinical Impression  Pt presents to PT with deficits in endurance, cardiopulmonary function, power, gait, balance, and functional mobility. PT with shallow and rapid breathing at times, PT encourages pursed lip breathing technique and use of incentive spirometer to improve lung capacity with activity. Pt requires close guard from PT during session for safety, but does not need physical assistance at this time. Pt will continue to benefit from acute PT services to improve mobility quality and to further reduce falls risk. PT recommends HHPT and supervision from family.    Follow Up Recommendations Home health PT;Supervision - Intermittent    Equipment Recommendations  None recommended by PT (pt owns necessary DME)    Recommendations for Other Services       Precautions / Restrictions Precautions Precautions: Fall Restrictions Weight Bearing Restrictions: No      Mobility  Bed Mobility Overal bed mobility: Needs Assistance Bed Mobility: Rolling;Sidelying to Sit Rolling: Supervision Sidelying to sit: Min assist       General bed mobility comments: pt received and left in recliner  Transfers Overall transfer level: Needs assistance Equipment used: Rolling walker (2 wheeled) Transfers: Sit to/from Stand Sit to Stand: Min guard         General transfer comment: verbal cues for hand  placement  Ambulation/Gait Ambulation/Gait assistance: Min guard;Supervision Gait Distance (Feet): 250 Feet Assistive device: Rolling walker (2 wheeled) Gait Pattern/deviations: Step-to pattern Gait velocity: reduced Gait velocity interpretation: 1.31 - 2.62 ft/sec, indicative of limited community ambulator General Gait Details: pt with short step to gait, PT providing cues to cease ambulation and perform pursed lip breathing when saturation drops  Stairs            Wheelchair Mobility    Modified Rankin (Stroke Patients Only)       Balance Overall balance assessment: Mild deficits observed, not formally tested                                           Pertinent Vitals/Pain Pain Assessment: Faces Faces Pain Scale: Hurts even more Pain Location: ribs, left side Pain Descriptors / Indicators: Aching Pain Intervention(s): Monitored during session    Home Living Family/patient expects to be discharged to:: Private residence Living Arrangements: Parent Available Help at Discharge: Family;Available 24 hours/day Type of Home: House Home Access: Stairs to enter Entrance Stairs-Rails: None Entrance Stairs-Number of Steps: 1 Home Layout: Two level;Able to live on main level with bedroom/bathroom Home Equipment: Hand held shower head;Grab bars - tub/shower;Walker - 2 wheels;Walker - 4 wheels Additional Comments: Her room is upstairs, but since she fell the first time she has been sleeping downstairs on the couch. Her mother had offerred her to sleep in her bed downstairs, but pt was unable to get into it.    Prior Function Level of Independence:  Independent         Comments: in process of learning to cook, does her laundry and her mothers     Hand Dominance   Dominant Hand: Right    Extremity/Trunk Assessment   Upper Extremity Assessment Upper Extremity Assessment: Overall WFL for tasks assessed    Lower Extremity Assessment Lower Extremity  Assessment: Overall WFL for tasks assessed    Cervical / Trunk Assessment Cervical / Trunk Assessment: Normal  Communication   Communication: No difficulties  Cognition Arousal/Alertness: Awake/alert Behavior During Therapy: WFL for tasks assessed/performed Overall Cognitive Status: Within Functional Limits for tasks assessed                                        General Comments General comments (skin integrity, edema, etc.): BP stable from sitting to standing. Pt does desaturate intermittently during ambulation, sats ranging from 87-96% on RA. Pt sats increase quickly with cues for pursed lip breathing. RR also elevated up to 41 at times, PT provides further reinforcement for use of incentive spirometer    Exercises     Assessment/Plan    PT Assessment Patient needs continued PT services  PT Problem List Decreased activity tolerance;Decreased balance;Decreased mobility;Decreased knowledge of use of DME;Decreased knowledge of precautions;Cardiopulmonary status limiting activity;Pain       PT Treatment Interventions DME instruction;Gait training;Stair training;Functional mobility training;Therapeutic activities;Therapeutic exercise;Balance training;Neuromuscular re-education;Patient/family education    PT Goals (Current goals can be found in the Care Plan section)  Acute Rehab PT Goals Patient Stated Goal: To return to independence PT Goal Formulation: With patient/family Time For Goal Achievement: 03/16/20 Potential to Achieve Goals: Good    Frequency Min 3X/week   Barriers to discharge        Co-evaluation               AM-PAC PT "6 Clicks" Mobility  Outcome Measure Help needed turning from your back to your side while in a flat bed without using bedrails?: A Little Help needed moving from lying on your back to sitting on the side of a flat bed without using bedrails?: A Little Help needed moving to and from a bed to a chair (including a  wheelchair)?: A Little Help needed standing up from a chair using your arms (e.g., wheelchair or bedside chair)?: A Little Help needed to walk in hospital room?: A Little Help needed climbing 3-5 steps with a railing? : A Lot 6 Click Score: 17    End of Session   Activity Tolerance: Patient tolerated treatment well Patient left: in chair;with call bell/phone within reach;with chair alarm set;with family/visitor present Nurse Communication: Mobility status PT Visit Diagnosis: Other abnormalities of gait and mobility (R26.89);Other (comment) (cardiopulmonary endurance)    Time: 5093-2671 PT Time Calculation (min) (ACUTE ONLY): 23 min   Charges:   PT Evaluation $PT Eval Low Complexity: 1 Low PT Treatments $Gait Training: 8-22 mins        Arlyss Gandy, PT, DPT Acute Rehabilitation Pager: [phone]   Arlyss Gandy 03/02/2020, 1:48 PM

## 2020-03-02 NOTE — Progress Notes (Signed)
OT Cancellation Note  Patient Details Name: Norma Clark MRN: [id number] DOB: [birth year]   Cancelled Treatment:    Reason Eval/Treat Not Completed: Active bedrest order. I have chat texted with Dr. Janee Morn and he will let therapies know if pt can come off bedrest once he sees patient today.  Ignacia Palma, OTR/L Acute Rehab Services Pager [phone] Office [phone]     Evette Georges 03/02/2020, 7:44 AM

## 2020-03-02 NOTE — Progress Notes (Signed)
Patient ID: Barry BrunnerGlenda L Freestone, female   DOB: 1945-09-16, 74 y.o.   MRN: [id number]     Subjective: Eating with assist, C/O rib pain. Reports she is doing 500 on IS.  ROS negative except as listed above. Objective: Vital signs in last 24 hours: Temp:  [98.7 F (37.1 C)-99.6 F (37.6 C)] 98.7 F (37.1 C) (09/23 0313) Pulse Rate:  [71-94] 94 (09/23 0400) Resp:  [15-25] 25 (09/23 0400) BP: (121-158)/(58-76) 158/73 (09/23 0400) SpO2:  [94 %-97 %] 94 % (09/23 0313) Last BM Date: 02/27/20  Intake/Output from previous day: 09/22 0701 - 09/23 0700 In: 480 [P.O.:480] Out: 2350 [Urine:2350] Intake/Output this shift: Total I/O In: -  Out: 300 [Urine:300]  General appearance: alert and cooperative Resp: clear to auscultation bilaterally and L ribs tender Cardio: regular rate and rhythm GI: soft, LUQ tender, no gen tenderness Extremities: claves soft Neurologic: Mental status: F/C  Lab Results: CBC  Recent Labs    03/01/20 1321 03/02/20 0551  WBC 9.2 9.3  HGB 8.4* 8.8*  HCT 25.8* 27.2*  PLT 134* 147*   BMET No results for input(s): NA, K, CL, CO2, GLUCOSE, BUN, CREATININE, CALCIUM in the last 72 hours. PT/INR No results for input(s): LABPROT, INR in the last 72 hours. ABG No results for input(s): PHART, HCO3 in the last 72 hours.  Invalid input(s): PCO2, PO2  Studies/Results: ECHOCARDIOGRAM COMPLETE BUBBLE STUDY  Result Date: 02/29/2020    ECHOCARDIOGRAM REPORT   Patient Name:   Barry BrunnerGLENDA L Tingler Date of Exam: 02/29/2020 Medical Rec #:  [id number]          Height:       62.0 in Accession #:    [id number]         Weight:       113.0 lb Date of Birth:  1945-09-16         BSA:          1.500 m Patient Age:    73 years           BP:           113/55 mmHg Patient Gender: F                  HR:           78 bpm. Exam Location:  Inpatient Procedure: 2D Echo, Color Doppler, Cardiac Doppler and Saline Contrast Bubble            Study Indications:    R55 Syncope  History:         Patient has no prior history of Echocardiogram examinations.                 Risk Factors:Diabetes and Dyslipidemia.  Sonographer:    Irving BurtonEmily Senior RDCS Referring Phys: [id number] Diamantina MonksAYESHA N LOVICK IMPRESSIONS  1. Left ventricular ejection fraction, by estimation, is 60 to 65%. The left ventricle has normal function. The left ventricle has no regional wall motion abnormalities. Left ventricular diastolic parameters were normal.  2. Right ventricular systolic function is normal. The right ventricular size is normal. There is normal pulmonary artery systolic pressure.  3. A small pericardial effusion is present. The pericardial effusion is circumferential.  4. The mitral valve is normal in structure. No evidence of mitral valve regurgitation. No evidence of mitral stenosis.  5. The aortic valve is normal in structure. Aortic valve regurgitation is not visualized. Mild aortic valve sclerosis is present, with no evidence of aortic valve stenosis.  6. The  inferior vena cava is normal in size with greater than 50% respiratory variability, suggesting right atrial pressure of 3 mmHg.  7. Agitated saline contrast bubble study was negative, with no evidence of any interatrial shunt. FINDINGS  Left Ventricle: Left ventricular ejection fraction, by estimation, is 60 to 65%. The left ventricle has normal function. The left ventricle has no regional wall motion abnormalities. The left ventricular internal cavity size was normal in size. There is  no left ventricular hypertrophy. Left ventricular diastolic parameters were normal. Normal left ventricular filling pressure. Right Ventricle: The right ventricular size is normal. No increase in right ventricular wall thickness. Right ventricular systolic function is normal. There is normal pulmonary artery systolic pressure. The tricuspid regurgitant velocity is 2.02 m/s, and  with an assumed right atrial pressure of 8 mmHg, the estimated right ventricular systolic pressure is 24.3 mmHg.  Left Atrium: Left atrial size was normal in size. Right Atrium: Right atrial size was normal in size. Pericardium: A small pericardial effusion is present. The pericardial effusion is circumferential. Mitral Valve: The mitral valve is normal in structure. No evidence of mitral valve regurgitation. No evidence of mitral valve stenosis. Tricuspid Valve: The tricuspid valve is normal in structure. Tricuspid valve regurgitation is not demonstrated. No evidence of tricuspid stenosis. Aortic Valve: The aortic valve is normal in structure. Aortic valve regurgitation is not visualized. Mild aortic valve sclerosis is present, with no evidence of aortic valve stenosis. Pulmonic Valve: The pulmonic valve was normal in structure. Pulmonic valve regurgitation is mild. No evidence of pulmonic stenosis. Aorta: The aortic root is normal in size and structure. Venous: The inferior vena cava is normal in size with greater than 50% respiratory variability, suggesting right atrial pressure of 3 mmHg. IAS/Shunts: No atrial level shunt detected by color flow Doppler. Agitated saline contrast was given intravenously to evaluate for intracardiac shunting. Agitated saline contrast bubble study was negative, with no evidence of any interatrial shunt.  LEFT VENTRICLE PLAX 2D LVIDd:         3.80 cm  Diastology LVIDs:         2.40 cm  LV e' medial:    7.62 cm/s LV PW:         0.70 cm  LV E/e' medial:  13.0 LV IVS:        0.70 cm  LV e' lateral:   8.16 cm/s LVOT diam:     1.80 cm  LV E/e' lateral: 12.1 LV SV:         63 LV SV Index:   42 LVOT Area:     2.54 cm  RIGHT VENTRICLE RV S prime:     13.20 cm/s TAPSE (M-mode): 2.2 cm LEFT ATRIUM             Index       RIGHT ATRIUM           Index LA diam:        3.50 cm 2.33 cm/m  RA Area:     12.00 cm LA Vol (A2C):   39.3 ml 26.20 ml/m RA Volume:   24.60 ml  16.40 ml/m LA Vol (A4C):   48.5 ml 32.34 ml/m LA Biplane Vol: 47.2 ml 31.47 ml/m  AORTIC VALVE LVOT Vmax:   109.00 cm/s LVOT Vmean:   71.900 cm/s LVOT VTI:    0.247 m  AORTA Ao Root diam: 2.80 cm Ao Asc diam:  2.90 cm MITRAL VALVE  TRICUSPID VALVE MV Area (PHT): 3.31 cm     TR Peak grad:   16.3 mmHg MV Decel Time: 229 msec     TR Vmax:        202.00 cm/s MV E velocity: 98.80 cm/s MV A velocity: 120.00 cm/s  SHUNTS MV E/A ratio:  0.82         Systemic VTI:  0.25 m                             Systemic Diam: 1.80 cm Thurmon Fair MD Electronically signed by Thurmon Fair MD Signature Date/Time: 02/29/2020/4:21:17 PM    Final    VAS US CAROTID  Result Date: 02/29/2020 Carotid Arterial Duplex Study Indications:       Syncope. Comparison Study:  No prior studies. Performing Technologist: Jean Rosenthal  Examination Guidelines: A complete evaluation includes B-mode imaging, spectral Doppler, color Doppler, and power Doppler as needed of all accessible portions of each vessel. Bilateral testing is considered an integral part of a complete examination. Limited examinations for reoccurring indications may be performed as noted.  Right Carotid Findings: +----------+--------+--------+--------+------------------+------------------+           PSV cm/sEDV cm/sStenosisPlaque DescriptionComments           +----------+--------+--------+--------+------------------+------------------+ CCA Prox  111     37                                intimal thickening +----------+--------+--------+--------+------------------+------------------+ CCA Distal111     31                                                   +----------+--------+--------+--------+------------------+------------------+ ICA Prox  126     43      1-39%   calcific and focal                   +----------+--------+--------+--------+------------------+------------------+ ICA Distal97      37                                                   +----------+--------+--------+--------+------------------+------------------+ ECA       137     29                                                    +----------+--------+--------+--------+------------------+------------------+ +----------+--------+-------+----------------+-------------------+           PSV cm/sEDV cmsDescribe        Arm Pressure (mmHG) +----------+--------+-------+----------------+-------------------+ [id number]            Multiphasic, WNL                    +----------+--------+-------+----------------+-------------------+ +---------+--------+--+--------+--+---------+ VertebralPSV cm/s42EDV cm/s13Antegrade +---------+--------+--+--------+--+---------+  Left Carotid Findings: +----------+--------+--------+--------+------------------+------------------+           PSV cm/sEDV cm/sStenosisPlaque DescriptionComments           +----------+--------+--------+--------+------------------+------------------+ CCA Prox  135     28  intimal thickening +----------+--------+--------+--------+------------------+------------------+ CCA Distal120     31                                intimal thickening +----------+--------+--------+--------+------------------+------------------+ ICA Prox  129     30      1-39%   heterogenous                         +----------+--------+--------+--------+------------------+------------------+ ICA Distal129     48                                                   +----------+--------+--------+--------+------------------+------------------+ ECA       154     20                                                   +----------+--------+--------+--------+------------------+------------------+ +----------+--------+--------+----------------+-------------------+           PSV cm/sEDV cm/sDescribe        Arm Pressure (mmHG) +----------+--------+--------+----------------+-------------------+ Subclavian213             Multiphasic, WNL                     +----------+--------+--------+----------------+-------------------+ +---------+--------+--+--------+--+---------+ VertebralPSV cm/s91EDV cm/s37Antegrade +---------+--------+--+--------+--+---------+   Summary: Right Carotid: Velocities in the right ICA are consistent with a 1-39% stenosis. Left Carotid: Velocities in the left ICA are consistent with a 1-39% stenosis. Vertebrals:  Bilateral vertebral arteries demonstrate antegrade flow. Subclavians: Normal flow hemodynamics were seen in bilateral subclavian              arteries. *See table(s) above for measurements and observations.  Electronically signed by Waverly Ferrari MD on 02/29/2020 at 4:44:30 PM.    Final     Anti-infectives: Anti-infectives (From admission, onward)   None      Assessment/Plan:  Fall Grade III splenic lac with hemoperitoneum -Hb up to 8.8. Completed 3d bed rest - mobilize with PT/OT today L rib fx 5-6 - Pulm toilet, IS, PO pain control ? Syncopal fall, recurrent falls - carotid duplex and echo ok, plan outpatient Holter by Cardiology FEN -CM diet, glucerna, KVO IVF Urinary retention - started Urecholine and replaced foley 9/22; UA +/- and Ucx P DVT - SCDs, LMWH tomorrow if Hb stable Dispo: PT/OT, lives with mother who cares for her. I spoke with her mother at the bedside.  LOS: 4 days    Violeta Gelinas, MD, MPH, FACS Trauma & General Surgery Use [email or web address] to contact on call provider  03/02/2020

## 2020-03-02 NOTE — Evaluation (Signed)
Occupational Therapy Evaluation Patient Details Name: Norma Clark MRN: [id number] DOB: 1945-08-06 Today's Date: 03/02/2020    History of Present Illness The patient is a 74 year old white female who presents after standing up and getting lightheaded and falling down a couple of stairs at home today. She complains of some left-sided chest pain and some abdominal pain. She states that she also fell a week ago on the left side as well and at that time she felt like she broke some ribs. Found to have grade III splenic lac with hemoperitoneum, L rib fx 5-6. PHMx:diabetes, hyperlipidemia   Clinical Impression   This 74 yo female admitted with above presents to acute OT with PLOF of being totally independent with basic ADLs and most IADLs. Currently she is min A for mobility and setup/S-min A for basic ADLs. She will benefit from acute OT with follow up HHOT.    Follow Up Recommendations  Home health OT;Supervision/Assistance - 24 hour    Equipment Recommendations  None recommended by OT       Precautions / Restrictions Precautions Precautions: Fall Restrictions Weight Bearing Restrictions: No      Mobility Bed Mobility Overal bed mobility: Needs Assistance Bed Mobility: Rolling;Sidelying to Sit Rolling: Supervision Sidelying to sit: Min assist       General bed mobility comments: VCs for sequencing coming out on right hand side of bed, min A for trunk  Transfers Overall transfer level: Needs assistance Equipment used: Rolling walker (2 wheeled) Transfers: Sit to/from Stand Sit to Stand: Min assist         General transfer comment: VCs for safe hand placement    Balance Overall balance assessment: Mild deficits observed, not formally tested                                         ADL either performed or assessed with clinical judgement   ADL Overall ADL's : Needs assistance/impaired Eating/Feeding: Independent;Sitting   Grooming: Set  up;Sitting   Upper Body Bathing: Set up;Sitting   Lower Body Bathing: Minimal assistance;Sit to/from stand   Upper Body Dressing : Set up;Sitting   Lower Body Dressing: Minimal assistance;Sit to/from stand   Toilet Transfer: Minimal assistance;Ambulation;RW Toilet Transfer Details (indicate cue type and reason): bed>to recliner on other side of bed Toileting- Clothing Manipulation and Hygiene: Minimal assistance;Sit to/from stand         General ADL Comments: I discussed with pt and mother (in room) about how having a shower seat would be more safe     Vision Patient Visual Report: No change from baseline              Pertinent Vitals/Pain Pain Assessment: Faces Faces Pain Scale: Hurts little more Pain Location: left ribs with rolling in bed to right. At the end of the session in recliner she reported on pain Pain Descriptors / Indicators: Aching;Sore Pain Intervention(s): Limited activity within patient's tolerance;Monitored during session;Repositioned     Hand Dominance Right   Extremity/Trunk Assessment Upper Extremity Assessment Upper Extremity Assessment: Overall WFL for tasks assessed           Communication Communication Communication: No difficulties   Cognition Arousal/Alertness: Awake/alert Behavior During Therapy: WFL for tasks assessed/performed Overall Cognitive Status: Within Functional Limits for tasks assessed  General Comments  Did orthostatic on pt--stable, albeit systolic BPs were a bit high ([id number]). Pt without c/o of dizziness            Home Living Family/patient expects to be discharged to:: Private residence Living Arrangements: Parent Available Help at Discharge: Family;Available 24 hours/day Type of Home: House       Home Layout: Two level;Able to live on main level with bedroom/bathroom Alternate Level Stairs-Number of Steps: flight   Bathroom Shower/Tub: Tub/shower  unit;Curtain   Bathroom Toilet: Standard     Home Equipment: Hand held shower head;Grab bars - tub/shower   Additional Comments: Her room is upstairs, but since she fell the first time she has been sleeping downstairs on the couch. Her mother had offerred her to sleep in her bed downstairs, but pt was unable to get into it.      Prior Functioning/Environment Level of Independence: Independent        Comments: in process of learning to cook, does her laundry and her mothers        OT Problem List: Impaired balance (sitting and/or standing);Pain      OT Treatment/Interventions: Self-care/ADL training;DME and/or AE instruction;Patient/family education;Balance training    OT Goals(Current goals can be found in the care plan section) Acute Rehab OT Goals Patient Stated Goal: to go home OT Goal Formulation: With patient/family Time For Goal Achievement: 03/16/20 Potential to Achieve Goals: Good  OT Frequency: Min 2X/week              AM-PAC OT "6 Clicks" Daily Activity     Outcome Measure Help from another person eating meals?: None Help from another person taking care of personal grooming?: A Little Help from another person toileting, which includes using toliet, bedpan, or urinal?: A Little Help from another person bathing (including washing, rinsing, drying)?: A Little Help from another person to put on and taking off regular upper body clothing?: A Little Help from another person to put on and taking off regular lower body clothing?: A Little 6 Click Score: 19   End of Session Equipment Utilized During Treatment: Gait belt;Rolling walker Nurse Communication: Mobility status  Activity Tolerance: Patient tolerated treatment well Patient left: in chair;with call bell/phone within reach;with chair alarm set;with family/visitor present  OT Visit Diagnosis: Unsteadiness on feet (R26.81);Other abnormalities of gait and mobility (R26.89);Pain Pain - part of body:  (left  ribs)                Time: 0277-4128 OT Time Calculation (min): 44 min Charges:  OT General Charges $OT Visit: 1 Visit OT Evaluation $OT Eval Moderate Complexity: 1 Mod OT Treatments $Self Care/Home Management : 23-37 mins  Ignacia Palma, OTR/L Acute [employer] Pager [phone] Office [phone]     Evette Georges 03/02/2020, 11:10 AM

## 2020-03-02 NOTE — Care Management Important Message (Signed)
Important Message  Patient Details  Name: Norma Clark MRN: [id number] Date of Birth: 1945-11-08   Medicare Important Message Given:  Yes     Dorena Bodo 03/02/2020, 3:27 PM

## 2020-03-03 LAB — CBC
HCT: 28 % — ABNORMAL LOW (ref 36.0–46.0)
Hemoglobin: 9.2 g/dL — ABNORMAL LOW (ref 12.0–15.0)
MCH: 31.2 pg (ref 26.0–34.0)
MCHC: 32.9 g/dL (ref 30.0–36.0)
MCV: 94.9 fL (ref 80.0–100.0)
Platelets: 176 10*3/uL (ref 150–400)
RBC: 2.95 MIL/uL — ABNORMAL LOW (ref 3.87–5.11)
RDW: 12.9 % (ref 11.5–15.5)
WBC: 10.2 10*3/uL (ref 4.0–10.5)
nRBC: 0 % (ref 0.0–0.2)

## 2020-03-03 LAB — GLUCOSE, CAPILLARY
Glucose-Capillary: 209 mg/dL — ABNORMAL HIGH (ref 70–99)
Glucose-Capillary: 279 mg/dL — ABNORMAL HIGH (ref 70–99)

## 2020-03-03 LAB — URINE CULTURE: Culture: >=100000 — AB

## 2020-03-03 MED ORDER — SULFAMETHOXAZOLE-TRIMETHOPRIM 800-160 MG PO TABS: 2.0000 | ORAL_TABLET | Freq: Two times a day (BID) | ORAL | Status: DC

## 2020-03-03 MED ORDER — CEPHALEXIN 500 MG PO CAPS: 500.0000 mg | ORAL_CAPSULE | Freq: Four times a day (QID) | ORAL | 0 refills | Status: AC

## 2020-03-03 MED ORDER — INSULIN ASPART 100 UNIT/ML ~~LOC~~ SOLN
0.0000 [IU] | Freq: Three times a day (TID) | SUBCUTANEOUS | Status: DC
  Administered 2020-03-03: 11 [IU] via SUBCUTANEOUS

## 2020-03-03 MED ORDER — ONDANSETRON 4 MG PO TBDP: 4.0000 mg | ORAL_TABLET | Freq: Four times a day (QID) | ORAL | 0 refills | Status: AC | PRN

## 2020-03-03 MED ORDER — CEPHALEXIN 500 MG PO CAPS
500.0000 mg | ORAL_CAPSULE | Freq: Four times a day (QID) | ORAL | Status: DC
  Administered 2020-03-03: 500 mg via ORAL
  Filled 2020-03-03: qty 1

## 2020-03-03 MED ORDER — ACETAMINOPHEN 325 MG PO TABS: 650.0000 mg | ORAL_TABLET | Freq: Four times a day (QID) | ORAL | Status: AC | PRN

## 2020-03-03 MED ORDER — FERROUS SULFATE 325 (65 FE) MG PO TABS: 325.0000 mg | ORAL_TABLET | Freq: Three times a day (TID) | ORAL | 0 refills | Status: AC

## 2020-03-03 MED ORDER — LISINOPRIL 10 MG PO TABS: 10.0000 mg | ORAL_TABLET | Freq: Every day | ORAL | Status: DC

## 2020-03-03 MED ORDER — DOCUSATE SODIUM 100 MG PO CAPS: 100.0000 mg | ORAL_CAPSULE | Freq: Two times a day (BID) | ORAL | 0 refills | Status: AC | PRN

## 2020-03-03 MED ORDER — TRAMADOL HCL 50 MG PO TABS
50.0000 mg | ORAL_TABLET | Freq: Four times a day (QID) | ORAL | 0 refills | Status: AC | PRN
Start: 2020-03-03 — End: ?

## 2020-03-03 NOTE — Plan of Care (Signed)

## 2020-03-03 NOTE — Discharge Instructions (Signed)
Splenic Injury  A splenic injury is an injury of the spleen. The spleen is an organ located in the upper left area of the abdomen, just under the ribs. The spleen filters and cleans the blood. It also stores blood cells and destroys cells that are worn out. The spleen also plays an important role in fighting disease. Splenic injuries can vary. In some cases, the spleen may only be bruised, with some bleeding inside the covering and around the spleen. Splenic injuries may also cause a deep tear or cut into the spleen (lacerated spleen). Some splenic injuries can cause the spleen to break open (rupture). What are the causes? This condition may be caused by a direct blow (trauma) to the spleen. Trauma can result from:  Car accidents.  Contact sports.  Falls.  Penetrating injuries. These can be caused by gunshot wounds or sharp objects such as a knife. What increases the risk? You may be at greater risk for a splenic injury if you have a disease that can cause the spleen to become enlarged. These include:  Alcoholic liver disease.  Viral infections, especially mononucleosis.  Certain inflammatory diseases, such as lupus.  Certain cancers, especially those that involve the lymphatic system.  Cystic fibrosis. What are the signs or symptoms? Symptoms of this condition depend on the severity of the injury. A minor injury often causes no symptoms or only minor pain in the abdomen. A major injury can result in severe bleeding, causing your blood pressure to decrease rapidly. This in turn will cause symptoms such as:  Dizziness or light-headedness.  Rapid heart rate.  Difficulty breathing.  Fainting.  Sweating with clammy skin. Other symptoms of a splenic injury may include:  Very bad abdominal pain.  Pain in the left shoulder.  Pain when the abdomen is pressed (tenderness).  Nausea.  Swelling or bruising of the abdomen. How is this diagnosed? This condition may be diagnosed  based on:  Your symptoms and medical history, especially if you were recently in an accident or you recently got hurt.  A physical exam.  Imaging tests, such as: ? CT scan. ? Ultrasound. You may have frequent blood tests for a few days after the injury to monitor your condition. How is this treated? Treatment depends on the type of splenic injury you have and how bad it is.  Less severe injuries may be treated with: ? Observation. ? Interventional radiology. This involves using flexible tubes (catheters) to stop the bleeding from inside the blood vessel.  More severe injuries may require hospitalization in the intensive care unit (ICU). While you are in the hospital: ? Your fluid and blood levels will be monitored closely. ? You will get fluids through an IV as needed. ? You may need follow-up scans to check whether your spleen is able to heal itself. If the injury is getting worse, you may need surgery. ? You may receive donated blood (transfusion). ? You may have a long needle inserted into your abdomen to remove any blood that has collected inside the spleen (hematoma).  If your blood pressure is too low, you may need emergency surgery. This may include: ? Repairing a laceration. ? Removing part of the spleen. ? Removing the entire spleen (splenectomy). Follow these instructions at home: Activity  Rest as told by your health care provider.  Avoid sitting for a long time without moving. Get up to take short walks every 1-2 hours. This is important to improve blood flow and breathing. Ask for help   if you feel weak or unsteady.  Do not participate in any activity that takes a lot of effort until your health care provider says that it is safe.  Do not take part in contact sports until your health care provider says it is safe to do so.  Do not lift anything that is heavier than 10 lb (4.5 kg), or the limit that you are told, until your health care provider says that it is  safe. General instructions  Take over-the-counter and prescription medicines only as told by your health care provider.  Stay up-to-date on vaccines as told by your health care provider.  Follow instructions from your health care provider about eating or drinking restrictions.  Do not drink alcohol.  Do not use any products that contain nicotine or tobacco, such as cigarettes and e-cigarettes. These may delay healing after an injury. If you need help quitting, ask your health care provider.  Keep all follow-up visits as told by your health care provider. This is important. Get help right away if you have:  A fever.  New or increasing pain in your abdomen or in your left shoulder.  Signs or symptoms of internal bleeding. Watch for: ? Sweating. ? Dizziness. ? Weakness. ? Cold and clammy skin. ? Fainting.  Chest pain or difficulty breathing. Summary  The spleen is an organ located in the upper left area of the abdomen, just under the ribs.  A splenic injury is an injury of the spleen. This may be caused by a direct blow (trauma) to the spleen.  You may be at greater risk for a splenic injury if you have a disease that can cause the spleen to become enlarged.  A minor injury often causes no symptoms or only minor pain in the abdomen. A major injury can result in severe bleeding, causing your blood pressure to decrease rapidly.  Treatment depends on the type of splenic injury you have and how bad it is. This information is not intended to replace advice given to you by your health care provider. Make sure you discuss any questions you have with your health care provider. Document Revised: 08/08/2017 Document Reviewed: 05/21/2017 Elsevier Patient Education  2020 [employer]   Rib Fracture  A rib fracture is a break or crack in one of the bones of the ribs. The ribs are like a cage that goes around your upper chest. A broken or cracked rib is often painful, but most do not  cause other problems. Most rib fractures usually heal on their own in 1-3 months. Follow these instructions at home: Managing pain, stiffness, and swelling  If directed, apply ice to the injured area. ? Put ice in a plastic bag. ? Place a towel between your skin and the bag. ? Leave the ice on for 20 minutes, 2-3 times a day.  Take over-the-counter and prescription medicines only as told by your doctor. Activity  Avoid activities that cause pain to the injured area. Protect your injured area.  Slowly increase activity as told by your doctor. General instructions  Do deep breathing as told by your doctor. You may be told to: ? Take deep breaths many times a day. ? Cough many times a day while hugging a pillow. ? Use a device (incentive spirometer) to do deep breathing many times a day.  Drink enough fluid to keep your pee (urine) clear or pale yellow.  Do not wear a rib belt or binder. These do not allow you to breathe  deeply.  Keep all follow-up visits as told by your doctor. This is important. Contact a doctor if:  You have a fever. Get help right away if:  You have trouble breathing.  You are short of breath.  You cannot stop coughing.  You cough up thick or bloody spit (sputum).  You feel sick to your stomach (nauseous), throw up (vomit), or have belly (abdominal) pain.  Your pain gets worse and medicine does not help. Summary  A rib fracture is a break or crack in one of the bones of the ribs.  Apply ice to the injured area and take medicines for pain as told by your doctor.  Take deep breaths and cough many times a day. Hug a pillow every time you cough. This information is not intended to replace advice given to you by your health care provider. Make sure you discuss any questions you have with your health care provider. Document Revised: 05/09/2017 Document Reviewed: 08/27/2016 Elsevier Patient Education  2020 [employer].

## 2020-03-03 NOTE — Progress Notes (Signed)
Occupational Therapy Treatment and Discharge Patient Details Name: Norma Clark MRN: [id number] DOB: [birth year] Today's Date: 03/03/2020    History of present illness The patient is a 74 year old white female who presents after standing up and getting lightheaded and falling down a couple of stairs at home today. She complains of some left-sided chest pain and some abdominal pain. She states that she also fell a week ago on the left side as well and at that time she felt like she broke some ribs. Found to have grade III splenic lac with hemoperitoneum, L rib fx 5-6. PHMx:diabetes, hyperlipidemia   OT comments  Norma Clark is doing well today. She was able to complete a B/D session at sink sit<>stand with setup/S and ambulation in room with min guard A without AD. She is progressing well. Plan is for possible home this PM, we will D/C from acute OT with HHOT follow up recommended.  Follow Up Recommendations  Home health OT;Supervision/Assistance - 24 hour    Equipment Recommendations  None recommended by OT       Precautions / Restrictions Precautions Precautions: Fall Precaution Comments: monitor SpO2 (stayed 92% or above) on RA Restrictions Weight Bearing Restrictions: No       Mobility Bed Mobility               General bed mobility comments: Pt up in recliner upon arrival  Transfers Overall transfer level: Needs assistance Equipment used: None Transfers: Sit to/from Stand Sit to Stand: Supervision         General transfer comment: ambulation without AD in room min guard A    Balance Overall balance assessment: Needs assistance Sitting-balance support: Feet supported Sitting balance-Leahy Scale: Good Sitting balance - Comments: supervision   Standing balance support: No upper extremity supported Standing balance-Leahy Scale: Fair Standing balance comment: standing at sink to wash up                           ADL either performed or  assessed with clinical judgement   ADL Overall ADL's : Needs assistance/impaired     Grooming: Set up;Supervision/safety;Standing;Wash/dry face;Wash/dry hands;Oral care;Brushing hair   Upper Body Bathing: Supervision/ safety;Set up;Standing Upper Body Bathing Details (indicate cue type and reason): sponge bath a sink Lower Body Bathing: Supervison/ safety;Set up;Sit to/from stand Lower Body Bathing Details (indicate cue type and reason): sponge bath a sink Upper Body Dressing : Set up;Sitting Upper Body Dressing Details (indicate cue type and reason): gown Lower Body Dressing: Set up;Supervision/safety;Sit to/from stand                       Vision Patient Visual Report: No change from baseline            Cognition Arousal/Alertness: Awake/alert Behavior During Therapy: WFL for tasks assessed/performed Overall Cognitive Status: Within Functional Limits for tasks assessed                                                General Comments Pt's sats stayed at 92% or better on RA during her B/D at sink    Pertinent Vitals/ Pain       Pain Assessment: No/denies pain Faces Pain Scale: Hurts even more Pain Location: ribs with coughing Pain Descriptors / Indicators: Aching Pain Intervention(s): Monitored during session  Prior Functioning/Environment              Frequency  Min 2X/week        Progress Toward Goals  OT Goals(current goals can now be found in the care plan section)  Progress towards OT goals: Progressing toward goals  Acute Rehab OT Goals Patient Stated Goal: to go home later today OT Goal Formulation: With patient/family Time For Goal Achievement: 03/16/20 Potential to Achieve Goals: Good  Plan Discharge plan remains appropriate       AM-PAC OT "6 Clicks" Daily Activity     Outcome Measure   Help from another person eating meals?: None Help from another person taking care of personal grooming?: A Little Help from  another person toileting, which includes using toliet, bedpan, or urinal?: A Little Help from another person bathing (including washing, rinsing, drying)?: A Little Help from another person to put on and taking off regular upper body clothing?: A Little Help from another person to put on and taking off regular lower body clothing?: A Little 6 Click Score: 19    End of Session    OT Visit Diagnosis: Unsteadiness on feet (R26.81);Other abnormalities of gait and mobility (R26.89)   Activity Tolerance Patient tolerated treatment well   Patient Left in chair;with call bell/phone within reach;with chair alarm set           Time: 9892-1194 OT Time Calculation (min): 26 min  Charges: OT General Charges $OT Visit: 1 Visit OT Treatments $Self Care/Home Management : 23-37 mins  Ignacia Palma, OTR/L Acute [employer] Pager [phone] Office [phone]      Evette Georges 03/03/2020, 11:31 AM

## 2020-03-03 NOTE — Progress Notes (Signed)
Physical Therapy Treatment Patient Details Name: Norma Clark MRN: [id number] DOB: December 09, 1945 Today's Date: 03/03/2020    History of Present Illness The patient is a 74 year old white female who presents after standing up and getting lightheaded and falling down a couple of stairs at home today. She complains of some left-sided chest pain and some abdominal pain. She states that she also fell a week ago on the left side as well and at that time she felt like she broke some ribs. Found to have grade III splenic lac with hemoperitoneum, L rib fx 5-6. PHMx:diabetes, hyperlipidemia    PT Comments    Pt tolerates treatment well with improved ambulation distances. Pt negotiates stairs with use of railing and without balance deviations. PT continues to encourage use of incentive spirometer as well as hugging pillow to reduce discomfort and improve cough strength. Pt will continue to benefit from acute PT POC to aide in a return to independence. PT continues to recommend HHPT and supervision from family.   Follow Up Recommendations  Home health PT;Supervision - Intermittent     Equipment Recommendations  None recommended by PT (pt owns necessary DME)    Recommendations for Other Services       Precautions / Restrictions Precautions Precautions: Fall Precaution Comments: monitor SpO2 Restrictions Weight Bearing Restrictions: No    Mobility  Bed Mobility               General bed mobility comments: pt received and left sitting in recliner  Transfers Overall transfer level: Needs assistance Equipment used: Rolling walker (2 wheeled) Transfers: Sit to/from Stand Sit to Stand: Supervision            Ambulation/Gait Ambulation/Gait assistance: Supervision Gait Distance (Feet): 400 Feet Assistive device: Rolling walker (2 wheeled) Gait Pattern/deviations: Step-through pattern Gait velocity: reduced Gait velocity interpretation: 1.31 - 2.62 ft/sec, indicative of  limited community ambulator General Gait Details: pt with steady step through gait, no significant gait/balance deviations noted   Stairs Stairs: Yes Stairs assistance: Min guard Stair Management: One rail Left;Step to pattern Number of Stairs: 1     Wheelchair Mobility    Modified Rankin (Stroke Patients Only)       Balance Overall balance assessment: Needs assistance Sitting-balance support: No upper extremity supported;Feet supported Sitting balance-Leahy Scale: Good Sitting balance - Comments: supervision   Standing balance support: Single extremity supported Standing balance-Leahy Scale: Poor Standing balance comment: reliant on UE support of RW                            Cognition Arousal/Alertness: Awake/alert Behavior During Therapy: WFL for tasks assessed/performed Overall Cognitive Status: Within Functional Limits for tasks assessed                                        Exercises      General Comments General comments (skin integrity, edema, etc.): pt desats intermittently to 88% on RA when ambulating, SpO2 increases quickly with standing rest break. PT encourages use of pillow to reduce comfort and improve cough strength      Pertinent Vitals/Pain Pain Assessment: Faces Faces Pain Scale: Hurts even more Pain Location: ribs with coughing Pain Descriptors / Indicators: Aching Pain Intervention(s): Monitored during session    Home Living  Prior Function            PT Goals (current goals can now be found in the care plan section) Acute Rehab PT Goals Patient Stated Goal: To return to independence Progress towards PT goals: Progressing toward goals    Frequency    Min 3X/week      PT Plan Current plan remains appropriate    Co-evaluation              AM-PAC PT "6 Clicks" Mobility   Outcome Measure  Help needed turning from your back to your side while in a flat bed  without using bedrails?: A Little Help needed moving from lying on your back to sitting on the side of a flat bed without using bedrails?: A Little Help needed moving to and from a bed to a chair (including a wheelchair)?: None Help needed standing up from a chair using your arms (e.g., wheelchair or bedside chair)?: None Help needed to walk in hospital room?: None Help needed climbing 3-5 steps with a railing? : A Little 6 Click Score: 21    End of Session   Activity Tolerance: Patient tolerated treatment well Patient left: in chair;with call bell/phone within reach;with family/visitor present Nurse Communication: Mobility status PT Visit Diagnosis: Other abnormalities of gait and mobility (R26.89);Other (comment)     Time: 3154-0086 PT Time Calculation (min) (ACUTE ONLY): 24 min  Charges:  $Gait Training: 23-37 mins                     Arlyss Gandy, PT, DPT Acute Rehabilitation Pager: [phone]    Arlyss Gandy 03/03/2020, 9:31 AM

## 2020-03-03 NOTE — Progress Notes (Addendum)
Patient ID: Norma Clark, female   DOB: 06-Jan-1946, 74 y.o.   MRN: [id number]     Subjective: Eating yogurt, up in the chair. Mother at bedside. Reports her pain is tolerable. Pulled 500 on IS for me. +flatus and BMs. Foley in place. Denies fever, chills, nausea, vomiting.  ROS negative except as listed above. Objective: Vital signs in last 24 hours: Temp:  [98 F (36.7 C)-99.6 F (37.6 C)] 99.6 F (37.6 C) (09/24 0755) Pulse Rate:  [79-89] 79 (09/24 0755) Resp:  [20-24] 20 (09/24 0755) BP: (137-152)/(57-73) 139/67 (09/24 0755) SpO2:  [92 %-99 %] 96 % (09/24 0755) Last BM Date: 02/27/20  Intake/Output from previous day: 09/23 0701 - 09/24 0700 In: 3061.5 [P.O.:720; I.V.:2341.5] Out: 1250 [Urine:1250] Intake/Output this shift: Total I/O In: -  Out: 900 [Urine:900]  General appearance: alert and cooperative Resp: clear to auscultation bilaterally and L ribs tender Cardio: regular rate and rhythm GI: soft, LUQ tender, no gen tenderness Extremities: claves soft Neurologic: Mental status: F/C  Lab Results: CBC  Recent Labs    03/02/20 0551 03/03/20 0522  WBC 9.3 10.2  HGB 8.8* 9.2*  HCT 27.2* 28.0*  PLT 147* 176   BMET No results for input(s): NA, K, CL, CO2, GLUCOSE, BUN, CREATININE, CALCIUM in the last 72 hours. PT/INR No results for input(s): LABPROT, INR in the last 72 hours. ABG No results for input(s): PHART, HCO3 in the last 72 hours.  Invalid input(s): PCO2, PO2  Studies/Results: No results found.  Anti-infectives: Anti-infectives (From admission, onward)   None      Assessment/Plan:  Fall Grade III splenic lac with hemoperitoneum -Hb 9.2 from 8.8. Completed 3d bed rest - worked w PT/OT 9/23, [facility] PT/OT L rib fx 5-6 - Pulm toilet, IS, PO pain control ? Syncopal fall, recurrent falls - carotid duplex and echo ok, plan outpatient Holter by Cardiology FEN -CM diet, glucerna, KVO IVF Urinary retention - started Urecholine and replaced foley  9/22;  Ucx w/ pansensitive E.Coli. start PO abx today; repeat TOV today  DVT - SCDs, LMWH tomorrow if Hb stable ID - Keflex 9/24 >> plan to complete 5 day course for UTI, reviewed with pharmacy  Dispo: order [facility] PT/OT, repeat trial of void, possible PM discharge.   I spoke with her mother at the bedside. She has a doctors appointment at 2 or 3 but can take patient home after that if she is stable for discharge later today.    LOS: 5 days    Hosie Spangle, PA-C  Trauma & General Surgery Use [email or web address] to contact on call provider  03/03/2020

## 2020-03-03 NOTE — TOC Transition Note (Signed)
Transition of Care [facility]) - CM/SW Discharge Note   Patient Details  Name: Norma Clark MRN: [id number] Date of Birth: [birth year]  Transition [facility]) CM/SW Contact:  Glennon Mac, RN Phone Number: 03/03/2020, 3:36 PM   Clinical Narrative: The patient is a 74 year old white female who presents after standing up and getting lightheaded and falling down a couple of stairs at home. She complains of some left-sided chest pain and some abdominal pain. She states that she also fell a week ago on the left side as well and at that time she felt like she broke some ribs. Found to have grade III splenic lac with hemoperitoneum, L rib fx 5-6.     Prior to admission, patient independent and living at home with her 59 year old mother, who is very independent.  Her mother still works full-time, and is planning on taking some time off to care for her daughter 24/7 at discharge.  PT/OT recommending home health follow-up and patient agreeable to services.  Referral to [facility] for continued therapies.  Patient has no DME needs, as they have all needed equipment at home.                   Final next level of care: Home w Home Health Services Barriers to Discharge: Barriers Resolved   Patient Goals and CMS Choice   CMS Medicare.gov Compare Post Acute Care list provided to:: Patient Choice offered to / list presented to : Patient                        Discharge Plan and Services   Discharge Planning Services: CM Consult Post Acute Care Choice: Home Health                    HH Arranged: PT, OT [facility] Agency: [facility] Date [facility] Agency Contacted: 03/03/20 Time HH Agency Contacted: [phone] Representative spoke with at [facility] Agency: Lorenza Chick  Social Determinants of Health (SDOH) Interventions     Readmission Risk Interventions Readmission Risk Prevention Plan 03/03/2020  Post Dischage Appt Complete  Medication Screening Complete  Transportation  Screening Complete  Some recent data might be hidden   Quintella Baton, RN, BSN  Trauma/Neuro ICU Case Manager [phone]

## 2020-03-03 NOTE — Discharge Summary (Signed)
Patient ID: Norma Clark [id number] 03-29-1946 74 y.o.  Admit date: 02/27/2020 Discharge date: 03/03/2020  Admitting Diagnosis: Fall 3 left rib fractures  grade 3 laceration to the spleen  Discharge Diagnosis Fall Grade III splenic lacwith hemoperitoneum L rib fx 5-6  ? Syncopal fall, recurrent falls UTI   Consultants Cardiology  Procedures None   Reason for Admission: The patient is a 74 year old white female who presents after standing up and getting lightheaded and falling down a couple of stairs at home today.  She complains of some left-sided chest pain and some abdominal pain.  She denies any neck pain.  She denies any headaches.  She states that she also fell a week ago on the left side as well and at that time she felt like she broke some ribs.  She never went to the doctor to be worked up.  Hospital Course:  Patient was admitted to the trauma service.  She was transferred from [facility] to [facility], ICU.  Patient remained on bedrest.  Serial hemoglobins were monitored until stabilized.  Diet was advanced and tolerated.  Given patient's episode of presyncope she underwent echo and carotid duplex that did not show obvious cause of patient's symptoms.  Cardiology was consulted and recommended outpatient follow-up.  Patient did have urinary retention during admission. This resolved prior to discharge and patient voided without difficulty. Patient was found to have a E. Coli UTI and was started on appropriate abx.   Patient worked with therapies during admission recommended home health.  This was arranged. On 03/03/2020, the patient was voiding well, tolerating diet, working well with therapies, pain well controlled, vital signs stable and felt stable for discharge home. Follow up as noted below.   Allergies as of 03/03/2020   No Known Allergies     Medication List    TAKE these medications   acetaminophen 325 MG tablet Commonly known as: TYLENOL Take 2  tablets (650 mg total) by mouth every 6 (six) hours as needed.   alendronate 70 MG tablet Commonly known as: FOSAMAX Take 70 mg by mouth every Monday.   atorvastatin 40 MG tablet Commonly known as: LIPITOR Take 40 mg by mouth at bedtime.   Calcium 600+D3 600-800 MG-UNIT Tabs Generic drug: Calcium Carb-Cholecalciferol Take 1 tablet by mouth every morning.   cephALEXin 500 MG capsule Commonly known as: KEFLEX Take 1 capsule (500 mg total) by mouth every 6 (six) hours.   docusate sodium 100 MG capsule Commonly known as: COLACE Take 1 capsule (100 mg total) by mouth 2 (two) times daily as needed for mild constipation.   ezetimibe 10 MG tablet Commonly known as: ZETIA Take 10 mg by mouth every morning.   ferrous sulfate 325 (65 FE) MG tablet Take 1 tablet (325 mg total) by mouth 3 (three) times daily with meals for 7 days.   glimepiride 4 MG tablet Commonly known as: AMARYL Take 2 mg by mouth every morning.   ibuprofen 200 MG tablet Commonly known as: ADVIL Take 400 mg by mouth every 4 (four) hours as needed (pain).   lisinopril 10 MG tablet Commonly known as: ZESTRIL Take 10 mg by mouth every morning.   metFORMIN 1000 MG tablet Commonly known as: GLUCOPHAGE Take 1,000 mg by mouth 2 (two) times daily.   ondansetron 4 MG disintegrating tablet Commonly known as: ZOFRAN-ODT Take 1 tablet (4 mg total) by mouth every 6 (six) hours as needed for nausea.   traMADol 50 MG tablet Commonly  known as: ULTRAM Take 1 tablet (50 mg total) by mouth every 6 (six) hours as needed.   Vitamin D-3 25 MCG (1000 UT) Caps Take 1,000 Units by mouth every morning.         Follow-up Information    [facility]. Call.   Why: Cardiology will contact you to arrange outpatient heart monitor, then set up follow up appointment to discuss results Contact information: [address], [address] [place] [address] [phone]       [facility]. Go  on 03/16/2020.   Why: at 10 AM for follow up of recent splenic injury and rib fractures. please arrive by 9:30 to get checked in and fill out any necessary paperwork. Contact information: [address] [place] [address] [phone]       Merlene Laughter, MD. Schedule an appointment as soon as possible for a visit.   Specialty: Internal Medicine Contact information: [address] [place] [address] [phone]               Signed: Leary Roca, [facility] 03/03/2020, 3:23 PM Please see Amion for pager number during day hours 7:00am-4:30pm

## 2020-03-06 DIAGNOSIS — D508 Other iron deficiency anemias: Secondary | ICD-10-CM | POA: Diagnosis not present

## 2020-03-06 DIAGNOSIS — E1165 Type 2 diabetes mellitus with hyperglycemia: Secondary | ICD-10-CM | POA: Diagnosis not present

## 2020-03-06 DIAGNOSIS — M810 Age-related osteoporosis without current pathological fracture: Secondary | ICD-10-CM | POA: Diagnosis not present

## 2020-03-06 DIAGNOSIS — E1169 Type 2 diabetes mellitus with other specified complication: Secondary | ICD-10-CM | POA: Diagnosis not present

## 2020-03-06 DIAGNOSIS — I10 Essential (primary) hypertension: Secondary | ICD-10-CM | POA: Diagnosis not present

## 2020-03-06 DIAGNOSIS — E7800 Pure hypercholesterolemia, unspecified: Secondary | ICD-10-CM | POA: Diagnosis not present

## 2020-03-09 DIAGNOSIS — E1169 Type 2 diabetes mellitus with other specified complication: Secondary | ICD-10-CM | POA: Diagnosis not present

## 2020-03-09 DIAGNOSIS — Z7984 Long term (current) use of oral hypoglycemic drugs: Secondary | ICD-10-CM | POA: Diagnosis not present

## 2020-03-09 DIAGNOSIS — S36039A Unspecified laceration of spleen, initial encounter: Secondary | ICD-10-CM | POA: Diagnosis not present

## 2020-03-09 DIAGNOSIS — I10 Essential (primary) hypertension: Secondary | ICD-10-CM | POA: Diagnosis not present

## 2020-03-09 DIAGNOSIS — D508 Other iron deficiency anemias: Secondary | ICD-10-CM | POA: Diagnosis not present

## 2020-03-09 DIAGNOSIS — Z23 Encounter for immunization: Secondary | ICD-10-CM | POA: Diagnosis not present

## 2020-03-13 DIAGNOSIS — S2242XD Multiple fractures of ribs, left side, subsequent encounter for fracture with routine healing: Secondary | ICD-10-CM | POA: Diagnosis not present

## 2020-03-13 DIAGNOSIS — S36039D Unspecified laceration of spleen, subsequent encounter: Secondary | ICD-10-CM | POA: Diagnosis not present

## 2020-03-13 DIAGNOSIS — I951 Orthostatic hypotension: Secondary | ICD-10-CM | POA: Diagnosis not present

## 2020-03-13 DIAGNOSIS — M47816 Spondylosis without myelopathy or radiculopathy, lumbar region: Secondary | ICD-10-CM | POA: Diagnosis not present

## 2020-03-13 DIAGNOSIS — E7800 Pure hypercholesterolemia, unspecified: Secondary | ICD-10-CM | POA: Diagnosis not present

## 2020-03-13 DIAGNOSIS — I119 Hypertensive heart disease without heart failure: Secondary | ICD-10-CM | POA: Diagnosis not present

## 2020-03-13 DIAGNOSIS — E119 Type 2 diabetes mellitus without complications: Secondary | ICD-10-CM | POA: Diagnosis not present

## 2020-03-13 DIAGNOSIS — M5136 Other intervertebral disc degeneration, lumbar region: Secondary | ICD-10-CM | POA: Diagnosis not present

## 2020-03-13 DIAGNOSIS — E785 Hyperlipidemia, unspecified: Secondary | ICD-10-CM | POA: Diagnosis not present

## 2020-03-15 DIAGNOSIS — E119 Type 2 diabetes mellitus without complications: Secondary | ICD-10-CM | POA: Diagnosis not present

## 2020-03-15 DIAGNOSIS — E7800 Pure hypercholesterolemia, unspecified: Secondary | ICD-10-CM | POA: Diagnosis not present

## 2020-03-15 DIAGNOSIS — I951 Orthostatic hypotension: Secondary | ICD-10-CM | POA: Diagnosis not present

## 2020-03-15 DIAGNOSIS — M47816 Spondylosis without myelopathy or radiculopathy, lumbar region: Secondary | ICD-10-CM | POA: Diagnosis not present

## 2020-03-15 DIAGNOSIS — S36039D Unspecified laceration of spleen, subsequent encounter: Secondary | ICD-10-CM | POA: Diagnosis not present

## 2020-03-15 DIAGNOSIS — I119 Hypertensive heart disease without heart failure: Secondary | ICD-10-CM | POA: Diagnosis not present

## 2020-03-15 DIAGNOSIS — M5136 Other intervertebral disc degeneration, lumbar region: Secondary | ICD-10-CM | POA: Diagnosis not present

## 2020-03-15 DIAGNOSIS — E785 Hyperlipidemia, unspecified: Secondary | ICD-10-CM | POA: Diagnosis not present

## 2020-03-15 DIAGNOSIS — S2242XD Multiple fractures of ribs, left side, subsequent encounter for fracture with routine healing: Secondary | ICD-10-CM | POA: Diagnosis not present

## 2020-03-16 DIAGNOSIS — W19XXXA Unspecified fall, initial encounter: Secondary | ICD-10-CM | POA: Diagnosis not present

## 2020-03-16 DIAGNOSIS — S2242XA Multiple fractures of ribs, left side, initial encounter for closed fracture: Secondary | ICD-10-CM | POA: Diagnosis not present

## 2020-03-16 DIAGNOSIS — S36039A Unspecified laceration of spleen, initial encounter: Secondary | ICD-10-CM | POA: Diagnosis not present

## 2020-03-17 DIAGNOSIS — E7800 Pure hypercholesterolemia, unspecified: Secondary | ICD-10-CM | POA: Diagnosis not present

## 2020-03-17 DIAGNOSIS — E785 Hyperlipidemia, unspecified: Secondary | ICD-10-CM | POA: Diagnosis not present

## 2020-03-17 DIAGNOSIS — M47816 Spondylosis without myelopathy or radiculopathy, lumbar region: Secondary | ICD-10-CM | POA: Diagnosis not present

## 2020-03-17 DIAGNOSIS — M5136 Other intervertebral disc degeneration, lumbar region: Secondary | ICD-10-CM | POA: Diagnosis not present

## 2020-03-17 DIAGNOSIS — S2242XD Multiple fractures of ribs, left side, subsequent encounter for fracture with routine healing: Secondary | ICD-10-CM | POA: Diagnosis not present

## 2020-03-17 DIAGNOSIS — S36039D Unspecified laceration of spleen, subsequent encounter: Secondary | ICD-10-CM | POA: Diagnosis not present

## 2020-03-17 DIAGNOSIS — E119 Type 2 diabetes mellitus without complications: Secondary | ICD-10-CM | POA: Diagnosis not present

## 2020-03-17 DIAGNOSIS — I951 Orthostatic hypotension: Secondary | ICD-10-CM | POA: Diagnosis not present

## 2020-03-17 DIAGNOSIS — I119 Hypertensive heart disease without heart failure: Secondary | ICD-10-CM | POA: Diagnosis not present

## 2020-03-20 DIAGNOSIS — E7800 Pure hypercholesterolemia, unspecified: Secondary | ICD-10-CM | POA: Diagnosis not present

## 2020-03-20 DIAGNOSIS — I951 Orthostatic hypotension: Secondary | ICD-10-CM | POA: Diagnosis not present

## 2020-03-20 DIAGNOSIS — E785 Hyperlipidemia, unspecified: Secondary | ICD-10-CM | POA: Diagnosis not present

## 2020-03-20 DIAGNOSIS — M5136 Other intervertebral disc degeneration, lumbar region: Secondary | ICD-10-CM | POA: Diagnosis not present

## 2020-03-20 DIAGNOSIS — I119 Hypertensive heart disease without heart failure: Secondary | ICD-10-CM | POA: Diagnosis not present

## 2020-03-20 DIAGNOSIS — M47816 Spondylosis without myelopathy or radiculopathy, lumbar region: Secondary | ICD-10-CM | POA: Diagnosis not present

## 2020-03-20 DIAGNOSIS — S2242XD Multiple fractures of ribs, left side, subsequent encounter for fracture with routine healing: Secondary | ICD-10-CM | POA: Diagnosis not present

## 2020-03-20 DIAGNOSIS — E119 Type 2 diabetes mellitus without complications: Secondary | ICD-10-CM | POA: Diagnosis not present

## 2020-03-20 DIAGNOSIS — S36039D Unspecified laceration of spleen, subsequent encounter: Secondary | ICD-10-CM | POA: Diagnosis not present

## 2020-03-23 DIAGNOSIS — E7800 Pure hypercholesterolemia, unspecified: Secondary | ICD-10-CM | POA: Diagnosis not present

## 2020-03-23 DIAGNOSIS — E785 Hyperlipidemia, unspecified: Secondary | ICD-10-CM | POA: Diagnosis not present

## 2020-03-23 DIAGNOSIS — S36039D Unspecified laceration of spleen, subsequent encounter: Secondary | ICD-10-CM | POA: Diagnosis not present

## 2020-03-23 DIAGNOSIS — E119 Type 2 diabetes mellitus without complications: Secondary | ICD-10-CM | POA: Diagnosis not present

## 2020-03-23 DIAGNOSIS — M47816 Spondylosis without myelopathy or radiculopathy, lumbar region: Secondary | ICD-10-CM | POA: Diagnosis not present

## 2020-03-23 DIAGNOSIS — I119 Hypertensive heart disease without heart failure: Secondary | ICD-10-CM | POA: Diagnosis not present

## 2020-03-23 DIAGNOSIS — M5136 Other intervertebral disc degeneration, lumbar region: Secondary | ICD-10-CM | POA: Diagnosis not present

## 2020-03-23 DIAGNOSIS — S2242XD Multiple fractures of ribs, left side, subsequent encounter for fracture with routine healing: Secondary | ICD-10-CM | POA: Diagnosis not present

## 2020-03-23 DIAGNOSIS — I951 Orthostatic hypotension: Secondary | ICD-10-CM | POA: Diagnosis not present

## 2020-03-28 DIAGNOSIS — M47816 Spondylosis without myelopathy or radiculopathy, lumbar region: Secondary | ICD-10-CM | POA: Diagnosis not present

## 2020-03-28 DIAGNOSIS — E119 Type 2 diabetes mellitus without complications: Secondary | ICD-10-CM | POA: Diagnosis not present

## 2020-03-28 DIAGNOSIS — E7800 Pure hypercholesterolemia, unspecified: Secondary | ICD-10-CM | POA: Diagnosis not present

## 2020-03-28 DIAGNOSIS — I119 Hypertensive heart disease without heart failure: Secondary | ICD-10-CM | POA: Diagnosis not present

## 2020-03-28 DIAGNOSIS — E785 Hyperlipidemia, unspecified: Secondary | ICD-10-CM | POA: Diagnosis not present

## 2020-03-28 DIAGNOSIS — M5136 Other intervertebral disc degeneration, lumbar region: Secondary | ICD-10-CM | POA: Diagnosis not present

## 2020-03-28 DIAGNOSIS — I951 Orthostatic hypotension: Secondary | ICD-10-CM | POA: Diagnosis not present

## 2020-03-28 DIAGNOSIS — S36039D Unspecified laceration of spleen, subsequent encounter: Secondary | ICD-10-CM | POA: Diagnosis not present

## 2020-03-28 DIAGNOSIS — S2242XD Multiple fractures of ribs, left side, subsequent encounter for fracture with routine healing: Secondary | ICD-10-CM | POA: Diagnosis not present

## 2020-03-30 DIAGNOSIS — M47816 Spondylosis without myelopathy or radiculopathy, lumbar region: Secondary | ICD-10-CM | POA: Diagnosis not present

## 2020-03-30 DIAGNOSIS — E785 Hyperlipidemia, unspecified: Secondary | ICD-10-CM | POA: Diagnosis not present

## 2020-03-30 DIAGNOSIS — I119 Hypertensive heart disease without heart failure: Secondary | ICD-10-CM | POA: Diagnosis not present

## 2020-03-30 DIAGNOSIS — E119 Type 2 diabetes mellitus without complications: Secondary | ICD-10-CM | POA: Diagnosis not present

## 2020-03-30 DIAGNOSIS — S36039D Unspecified laceration of spleen, subsequent encounter: Secondary | ICD-10-CM | POA: Diagnosis not present

## 2020-03-30 DIAGNOSIS — E7800 Pure hypercholesterolemia, unspecified: Secondary | ICD-10-CM | POA: Diagnosis not present

## 2020-03-30 DIAGNOSIS — S2242XD Multiple fractures of ribs, left side, subsequent encounter for fracture with routine healing: Secondary | ICD-10-CM | POA: Diagnosis not present

## 2020-03-30 DIAGNOSIS — M5136 Other intervertebral disc degeneration, lumbar region: Secondary | ICD-10-CM | POA: Diagnosis not present

## 2020-03-30 DIAGNOSIS — I951 Orthostatic hypotension: Secondary | ICD-10-CM | POA: Diagnosis not present

## 2020-04-03 DIAGNOSIS — E1169 Type 2 diabetes mellitus with other specified complication: Secondary | ICD-10-CM | POA: Diagnosis not present

## 2020-04-03 DIAGNOSIS — D508 Other iron deficiency anemias: Secondary | ICD-10-CM | POA: Diagnosis not present

## 2020-04-03 DIAGNOSIS — M810 Age-related osteoporosis without current pathological fracture: Secondary | ICD-10-CM | POA: Diagnosis not present

## 2020-04-03 DIAGNOSIS — E7800 Pure hypercholesterolemia, unspecified: Secondary | ICD-10-CM | POA: Diagnosis not present

## 2020-04-03 DIAGNOSIS — E1165 Type 2 diabetes mellitus with hyperglycemia: Secondary | ICD-10-CM | POA: Diagnosis not present

## 2020-04-03 DIAGNOSIS — I10 Essential (primary) hypertension: Secondary | ICD-10-CM | POA: Diagnosis not present

## 2020-04-05 DIAGNOSIS — S2242XD Multiple fractures of ribs, left side, subsequent encounter for fracture with routine healing: Secondary | ICD-10-CM | POA: Diagnosis not present

## 2020-04-05 DIAGNOSIS — I119 Hypertensive heart disease without heart failure: Secondary | ICD-10-CM | POA: Diagnosis not present

## 2020-04-05 DIAGNOSIS — S36039D Unspecified laceration of spleen, subsequent encounter: Secondary | ICD-10-CM | POA: Diagnosis not present

## 2020-04-05 DIAGNOSIS — E7800 Pure hypercholesterolemia, unspecified: Secondary | ICD-10-CM | POA: Diagnosis not present

## 2020-04-05 DIAGNOSIS — M5136 Other intervertebral disc degeneration, lumbar region: Secondary | ICD-10-CM | POA: Diagnosis not present

## 2020-04-05 DIAGNOSIS — M47816 Spondylosis without myelopathy or radiculopathy, lumbar region: Secondary | ICD-10-CM | POA: Diagnosis not present

## 2020-04-05 DIAGNOSIS — E119 Type 2 diabetes mellitus without complications: Secondary | ICD-10-CM | POA: Diagnosis not present

## 2020-04-05 DIAGNOSIS — I951 Orthostatic hypotension: Secondary | ICD-10-CM | POA: Diagnosis not present

## 2020-04-05 DIAGNOSIS — E785 Hyperlipidemia, unspecified: Secondary | ICD-10-CM | POA: Diagnosis not present

## 2020-04-18 DIAGNOSIS — M810 Age-related osteoporosis without current pathological fracture: Secondary | ICD-10-CM | POA: Diagnosis not present

## 2020-04-18 DIAGNOSIS — Z1389 Encounter for screening for other disorder: Secondary | ICD-10-CM | POA: Diagnosis not present

## 2020-04-18 DIAGNOSIS — E1169 Type 2 diabetes mellitus with other specified complication: Secondary | ICD-10-CM | POA: Diagnosis not present

## 2020-04-18 DIAGNOSIS — Z23 Encounter for immunization: Secondary | ICD-10-CM | POA: Diagnosis not present

## 2020-04-18 DIAGNOSIS — I10 Essential (primary) hypertension: Secondary | ICD-10-CM | POA: Diagnosis not present

## 2020-04-18 DIAGNOSIS — Z0000 Encounter for general adult medical examination without abnormal findings: Secondary | ICD-10-CM | POA: Diagnosis not present

## 2020-04-18 DIAGNOSIS — E7800 Pure hypercholesterolemia, unspecified: Secondary | ICD-10-CM | POA: Diagnosis not present

## 2020-04-18 DIAGNOSIS — Z79899 Other long term (current) drug therapy: Secondary | ICD-10-CM | POA: Diagnosis not present

## 2020-04-18 DIAGNOSIS — Z7984 Long term (current) use of oral hypoglycemic drugs: Secondary | ICD-10-CM | POA: Diagnosis not present

## 2020-04-25 ENCOUNTER — Other Ambulatory Visit: Payer: Self-pay | Admitting: Geriatric Medicine

## 2020-04-25 DIAGNOSIS — Z1231 Encounter for screening mammogram for malignant neoplasm of breast: Secondary | ICD-10-CM

## 2020-05-02 DIAGNOSIS — E7800 Pure hypercholesterolemia, unspecified: Secondary | ICD-10-CM | POA: Diagnosis not present

## 2020-05-02 DIAGNOSIS — E1165 Type 2 diabetes mellitus with hyperglycemia: Secondary | ICD-10-CM | POA: Diagnosis not present

## 2020-05-02 DIAGNOSIS — E1169 Type 2 diabetes mellitus with other specified complication: Secondary | ICD-10-CM | POA: Diagnosis not present

## 2020-05-02 DIAGNOSIS — M810 Age-related osteoporosis without current pathological fracture: Secondary | ICD-10-CM | POA: Diagnosis not present

## 2020-05-02 DIAGNOSIS — D508 Other iron deficiency anemias: Secondary | ICD-10-CM | POA: Diagnosis not present

## 2020-05-02 DIAGNOSIS — I10 Essential (primary) hypertension: Secondary | ICD-10-CM | POA: Diagnosis not present

## 2020-05-25 DIAGNOSIS — D508 Other iron deficiency anemias: Secondary | ICD-10-CM | POA: Diagnosis not present

## 2020-05-25 DIAGNOSIS — I10 Essential (primary) hypertension: Secondary | ICD-10-CM | POA: Diagnosis not present

## 2020-05-25 DIAGNOSIS — E1169 Type 2 diabetes mellitus with other specified complication: Secondary | ICD-10-CM | POA: Diagnosis not present

## 2020-05-25 DIAGNOSIS — E1165 Type 2 diabetes mellitus with hyperglycemia: Secondary | ICD-10-CM | POA: Diagnosis not present

## 2020-05-25 DIAGNOSIS — M810 Age-related osteoporosis without current pathological fracture: Secondary | ICD-10-CM | POA: Diagnosis not present

## 2020-05-25 DIAGNOSIS — E7800 Pure hypercholesterolemia, unspecified: Secondary | ICD-10-CM | POA: Diagnosis not present

## 2020-06-06 ENCOUNTER — Other Ambulatory Visit: Payer: Self-pay

## 2020-06-06 ENCOUNTER — Ambulatory Visit
Admission: RE | Admit: 2020-06-06 | Discharge: 2020-06-06 | Disposition: A | Discharge status: Home / Self Care | Payer: Medicare PPO | Source: Ambulatory Visit | Attending: Geriatric Medicine | Admitting: Geriatric Medicine

## 2020-06-06 DIAGNOSIS — Z1231 Encounter for screening mammogram for malignant neoplasm of breast: Secondary | ICD-10-CM

## 2020-07-05 DIAGNOSIS — I10 Essential (primary) hypertension: Secondary | ICD-10-CM | POA: Diagnosis not present

## 2020-07-05 DIAGNOSIS — E1165 Type 2 diabetes mellitus with hyperglycemia: Secondary | ICD-10-CM | POA: Diagnosis not present

## 2020-07-05 DIAGNOSIS — E7800 Pure hypercholesterolemia, unspecified: Secondary | ICD-10-CM | POA: Diagnosis not present

## 2020-07-05 DIAGNOSIS — E1169 Type 2 diabetes mellitus with other specified complication: Secondary | ICD-10-CM | POA: Diagnosis not present

## 2020-07-05 DIAGNOSIS — D508 Other iron deficiency anemias: Secondary | ICD-10-CM | POA: Diagnosis not present

## 2020-07-05 DIAGNOSIS — M810 Age-related osteoporosis without current pathological fracture: Secondary | ICD-10-CM | POA: Diagnosis not present

## 2020-08-07 DIAGNOSIS — I10 Essential (primary) hypertension: Secondary | ICD-10-CM | POA: Diagnosis not present

## 2020-08-07 DIAGNOSIS — E1169 Type 2 diabetes mellitus with other specified complication: Secondary | ICD-10-CM | POA: Diagnosis not present

## 2020-08-07 DIAGNOSIS — E1165 Type 2 diabetes mellitus with hyperglycemia: Secondary | ICD-10-CM | POA: Diagnosis not present

## 2020-08-07 DIAGNOSIS — M810 Age-related osteoporosis without current pathological fracture: Secondary | ICD-10-CM | POA: Diagnosis not present

## 2020-08-07 DIAGNOSIS — E7800 Pure hypercholesterolemia, unspecified: Secondary | ICD-10-CM | POA: Diagnosis not present

## 2020-08-07 DIAGNOSIS — D508 Other iron deficiency anemias: Secondary | ICD-10-CM | POA: Diagnosis not present

## 2020-08-31 DIAGNOSIS — I10 Essential (primary) hypertension: Secondary | ICD-10-CM | POA: Diagnosis not present

## 2020-08-31 DIAGNOSIS — E1165 Type 2 diabetes mellitus with hyperglycemia: Secondary | ICD-10-CM | POA: Diagnosis not present

## 2020-08-31 DIAGNOSIS — E1169 Type 2 diabetes mellitus with other specified complication: Secondary | ICD-10-CM | POA: Diagnosis not present

## 2020-08-31 DIAGNOSIS — D508 Other iron deficiency anemias: Secondary | ICD-10-CM | POA: Diagnosis not present

## 2020-08-31 DIAGNOSIS — E7800 Pure hypercholesterolemia, unspecified: Secondary | ICD-10-CM | POA: Diagnosis not present

## 2020-08-31 DIAGNOSIS — M810 Age-related osteoporosis without current pathological fracture: Secondary | ICD-10-CM | POA: Diagnosis not present

## 2020-10-02 DIAGNOSIS — E1169 Type 2 diabetes mellitus with other specified complication: Secondary | ICD-10-CM | POA: Diagnosis not present

## 2020-10-02 DIAGNOSIS — E7800 Pure hypercholesterolemia, unspecified: Secondary | ICD-10-CM | POA: Diagnosis not present

## 2020-10-02 DIAGNOSIS — E1165 Type 2 diabetes mellitus with hyperglycemia: Secondary | ICD-10-CM | POA: Diagnosis not present

## 2020-10-02 DIAGNOSIS — D508 Other iron deficiency anemias: Secondary | ICD-10-CM | POA: Diagnosis not present

## 2020-10-02 DIAGNOSIS — M810 Age-related osteoporosis without current pathological fracture: Secondary | ICD-10-CM | POA: Diagnosis not present

## 2020-10-02 DIAGNOSIS — I10 Essential (primary) hypertension: Secondary | ICD-10-CM | POA: Diagnosis not present

## 2020-10-16 DIAGNOSIS — Z79899 Other long term (current) drug therapy: Secondary | ICD-10-CM | POA: Diagnosis not present

## 2020-10-16 DIAGNOSIS — I10 Essential (primary) hypertension: Secondary | ICD-10-CM | POA: Diagnosis not present

## 2020-10-16 DIAGNOSIS — E1169 Type 2 diabetes mellitus with other specified complication: Secondary | ICD-10-CM | POA: Diagnosis not present

## 2020-10-16 DIAGNOSIS — I700 Atherosclerosis of aorta: Secondary | ICD-10-CM | POA: Diagnosis not present

## 2020-11-03 DIAGNOSIS — E1169 Type 2 diabetes mellitus with other specified complication: Secondary | ICD-10-CM | POA: Diagnosis not present

## 2020-11-03 DIAGNOSIS — E7800 Pure hypercholesterolemia, unspecified: Secondary | ICD-10-CM | POA: Diagnosis not present

## 2020-11-03 DIAGNOSIS — E1165 Type 2 diabetes mellitus with hyperglycemia: Secondary | ICD-10-CM | POA: Diagnosis not present

## 2020-11-03 DIAGNOSIS — M810 Age-related osteoporosis without current pathological fracture: Secondary | ICD-10-CM | POA: Diagnosis not present

## 2020-11-03 DIAGNOSIS — I10 Essential (primary) hypertension: Secondary | ICD-10-CM | POA: Diagnosis not present

## 2020-11-03 DIAGNOSIS — D508 Other iron deficiency anemias: Secondary | ICD-10-CM | POA: Diagnosis not present

## 2020-11-28 DIAGNOSIS — I10 Essential (primary) hypertension: Secondary | ICD-10-CM | POA: Diagnosis not present

## 2020-11-28 DIAGNOSIS — E1169 Type 2 diabetes mellitus with other specified complication: Secondary | ICD-10-CM | POA: Diagnosis not present

## 2020-11-28 DIAGNOSIS — E7800 Pure hypercholesterolemia, unspecified: Secondary | ICD-10-CM | POA: Diagnosis not present

## 2020-11-28 DIAGNOSIS — E1165 Type 2 diabetes mellitus with hyperglycemia: Secondary | ICD-10-CM | POA: Diagnosis not present

## 2020-11-28 DIAGNOSIS — M810 Age-related osteoporosis without current pathological fracture: Secondary | ICD-10-CM | POA: Diagnosis not present

## 2020-11-28 DIAGNOSIS — D508 Other iron deficiency anemias: Secondary | ICD-10-CM | POA: Diagnosis not present

## 2020-12-05 ENCOUNTER — Ambulatory Visit: Payer: Medicare PPO | Attending: Internal Medicine

## 2020-12-05 ENCOUNTER — Other Ambulatory Visit: Payer: Self-pay

## 2020-12-05 ENCOUNTER — Other Ambulatory Visit (HOSPITAL_BASED_OUTPATIENT_CLINIC_OR_DEPARTMENT_OTHER): Payer: Self-pay

## 2020-12-05 DIAGNOSIS — Z23 Encounter for immunization: Secondary | ICD-10-CM

## 2020-12-05 MED ORDER — PFIZER-BIONT COVID-19 VAC-TRIS 30 MCG/0.3ML IM SUSP
INTRAMUSCULAR | 0 refills | Status: AC
  Filled 2020-12-05: qty 0.3, 1d supply, fill #0

## 2020-12-05 NOTE — Progress Notes (Signed)
   Covid-19 Vaccination Clinic  Name:  GARNET CHATMON    MRN: [id number] DOB: [birth year]  12/05/2020  Ms. Hainline was observed post Covid-19 immunization for 15 minutes without incident. She was provided with Vaccine Information Sheet and instruction to access the V-Safe system.   Ms. Quizon was instructed to call 911 with any severe reactions post vaccine: Difficulty breathing  Swelling of face and throat  A fast heartbeat  A bad rash all over body  Dizziness and weakness   Immunizations Administered     Name Date Dose VIS Date Route   PFIZER Comrnaty(Gray TOP) Covid-19 Vaccine 12/05/2020  3:09 PM 0.3 mL 05/18/2020 Intramuscular   Manufacturer: [employer], [employer]   Lot: [id number]   NDC: [phone]

## 2020-12-25 DIAGNOSIS — D508 Other iron deficiency anemias: Secondary | ICD-10-CM | POA: Diagnosis not present

## 2020-12-25 DIAGNOSIS — E1165 Type 2 diabetes mellitus with hyperglycemia: Secondary | ICD-10-CM | POA: Diagnosis not present

## 2020-12-25 DIAGNOSIS — E1169 Type 2 diabetes mellitus with other specified complication: Secondary | ICD-10-CM | POA: Diagnosis not present

## 2020-12-25 DIAGNOSIS — I10 Essential (primary) hypertension: Secondary | ICD-10-CM | POA: Diagnosis not present

## 2020-12-25 DIAGNOSIS — E7800 Pure hypercholesterolemia, unspecified: Secondary | ICD-10-CM | POA: Diagnosis not present

## 2020-12-25 DIAGNOSIS — M810 Age-related osteoporosis without current pathological fracture: Secondary | ICD-10-CM | POA: Diagnosis not present

## 2021-01-24 DIAGNOSIS — I10 Essential (primary) hypertension: Secondary | ICD-10-CM | POA: Diagnosis not present

## 2021-01-24 DIAGNOSIS — D508 Other iron deficiency anemias: Secondary | ICD-10-CM | POA: Diagnosis not present

## 2021-01-24 DIAGNOSIS — E1165 Type 2 diabetes mellitus with hyperglycemia: Secondary | ICD-10-CM | POA: Diagnosis not present

## 2021-01-24 DIAGNOSIS — E1169 Type 2 diabetes mellitus with other specified complication: Secondary | ICD-10-CM | POA: Diagnosis not present

## 2021-01-24 DIAGNOSIS — M810 Age-related osteoporosis without current pathological fracture: Secondary | ICD-10-CM | POA: Diagnosis not present

## 2021-01-24 DIAGNOSIS — E7800 Pure hypercholesterolemia, unspecified: Secondary | ICD-10-CM | POA: Diagnosis not present

## 2021-02-21 DIAGNOSIS — I10 Essential (primary) hypertension: Secondary | ICD-10-CM | POA: Diagnosis not present

## 2021-02-21 DIAGNOSIS — D508 Other iron deficiency anemias: Secondary | ICD-10-CM | POA: Diagnosis not present

## 2021-02-21 DIAGNOSIS — E1169 Type 2 diabetes mellitus with other specified complication: Secondary | ICD-10-CM | POA: Diagnosis not present

## 2021-02-21 DIAGNOSIS — E7800 Pure hypercholesterolemia, unspecified: Secondary | ICD-10-CM | POA: Diagnosis not present

## 2021-02-21 DIAGNOSIS — M810 Age-related osteoporosis without current pathological fracture: Secondary | ICD-10-CM | POA: Diagnosis not present

## 2021-02-21 DIAGNOSIS — E1165 Type 2 diabetes mellitus with hyperglycemia: Secondary | ICD-10-CM | POA: Diagnosis not present

## 2021-03-26 DIAGNOSIS — E7800 Pure hypercholesterolemia, unspecified: Secondary | ICD-10-CM | POA: Diagnosis not present

## 2021-03-26 DIAGNOSIS — I10 Essential (primary) hypertension: Secondary | ICD-10-CM | POA: Diagnosis not present

## 2021-03-26 DIAGNOSIS — D508 Other iron deficiency anemias: Secondary | ICD-10-CM | POA: Diagnosis not present

## 2021-03-26 DIAGNOSIS — M810 Age-related osteoporosis without current pathological fracture: Secondary | ICD-10-CM | POA: Diagnosis not present

## 2021-03-26 DIAGNOSIS — E1169 Type 2 diabetes mellitus with other specified complication: Secondary | ICD-10-CM | POA: Diagnosis not present

## 2021-03-31 IMAGING — CT CT HEAD W/O CM
3 series · 16 of 47 positions shown, 19 images · non-contrast
Comparison: None.

CLINICAL DATA: Nonspecific dizziness.  Two falls in the past week.

EXAM:
CT HEAD WITHOUT CONTRAST
TECHNIQUE: Contiguous axial images were obtained from the base of the skull
through the vertex without intravenous contrast.

[Series 4: head wo · axial · 0.47mm/px · z∈[+1231,+1371]mm · 10 of 34 slices shown, 13 images]
[im 3/34  brain]
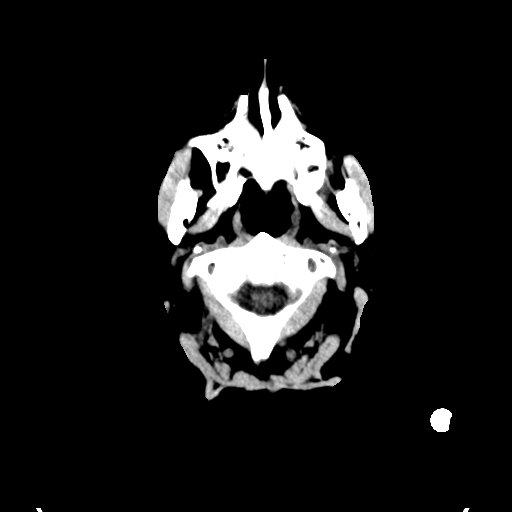
[im 3/34  bone]
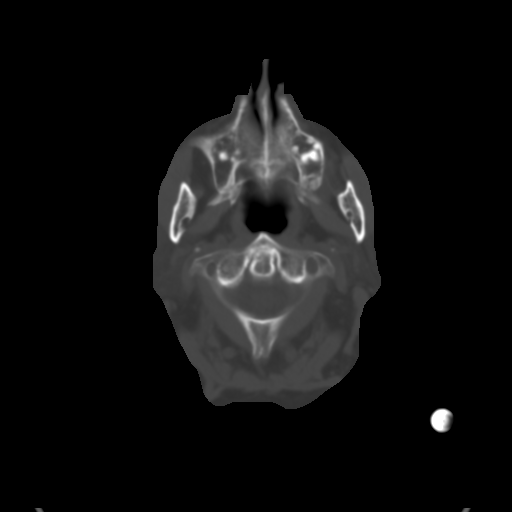
[im 6/34  brain]
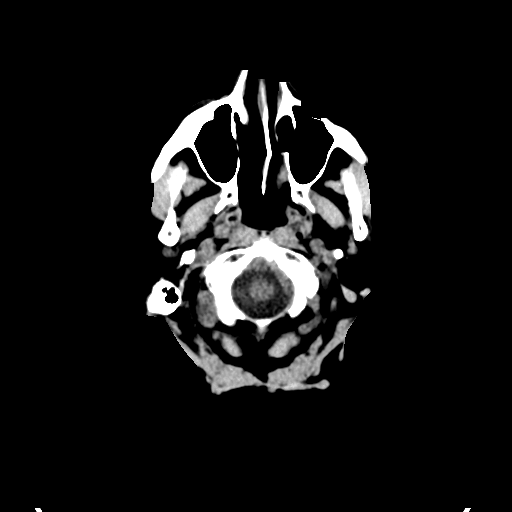
[im 10/34  brain]
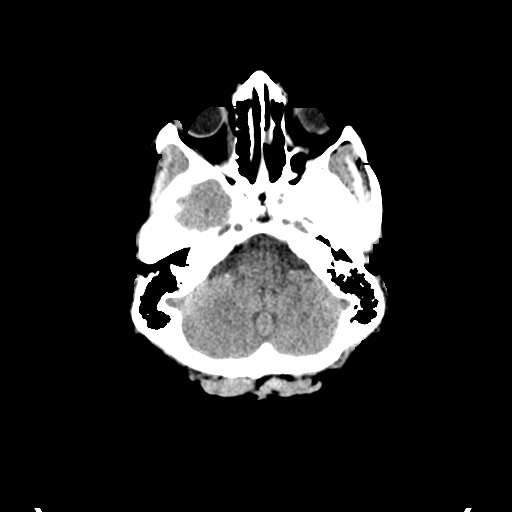
[im 12/34  brain]
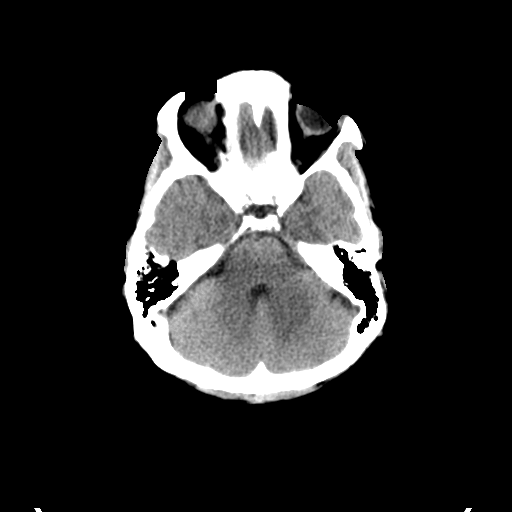
[im 15/34  brain]
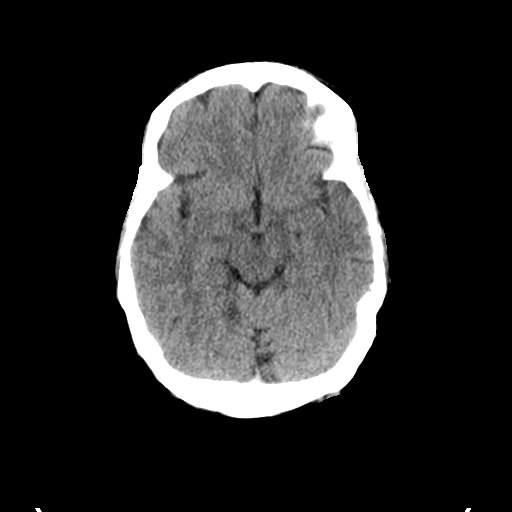
[im 15/34  bone]
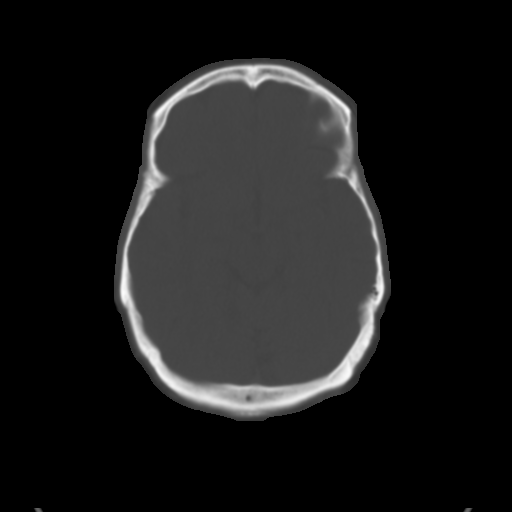
[im 19/34  brain]
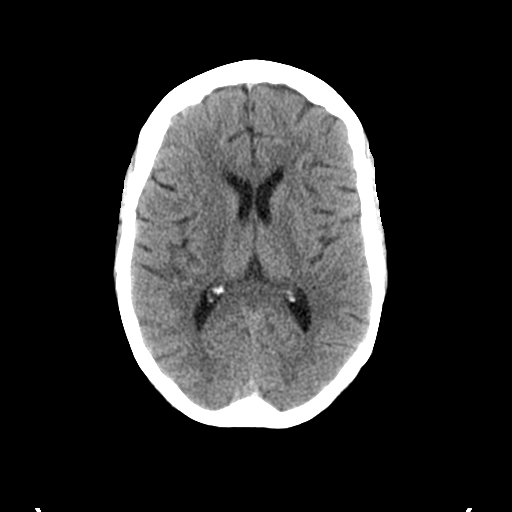
[im 22/34  brain]
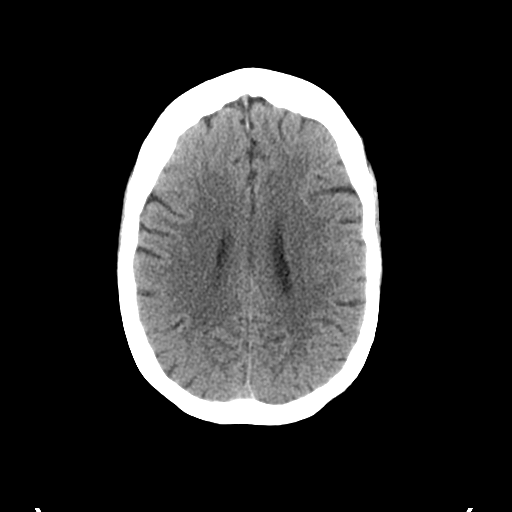
[im 26/34  brain]
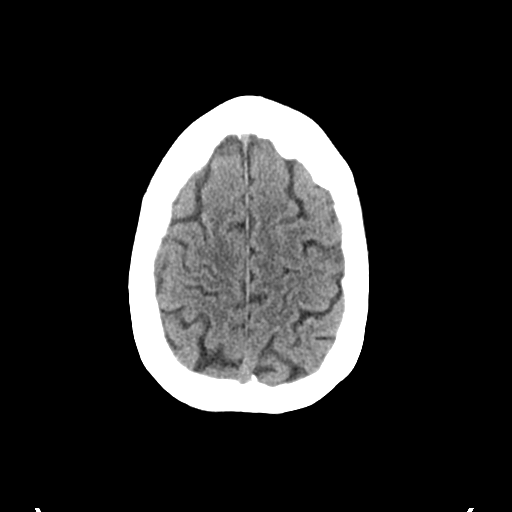
[im 28/34  brain]
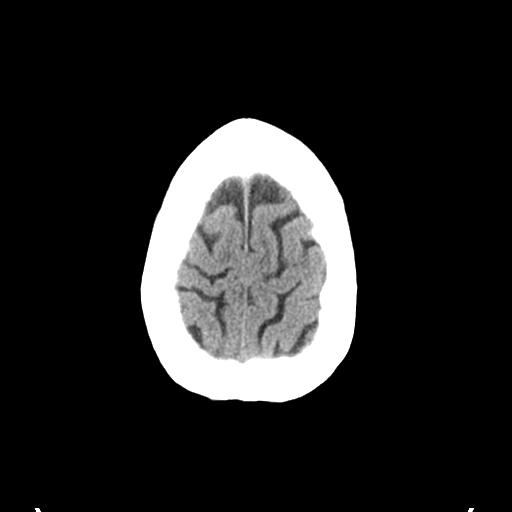
[im 28/34  bone]
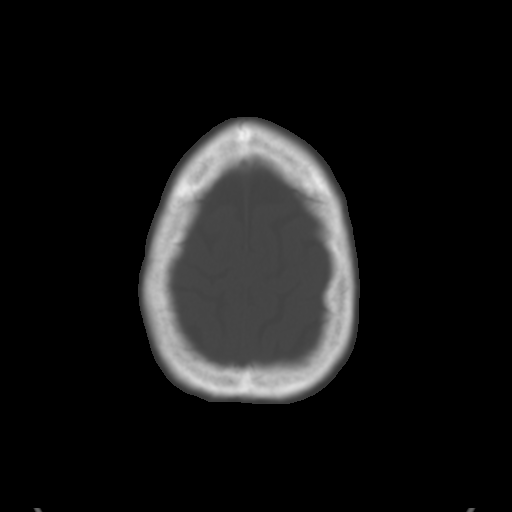
[im 31/34  brain]
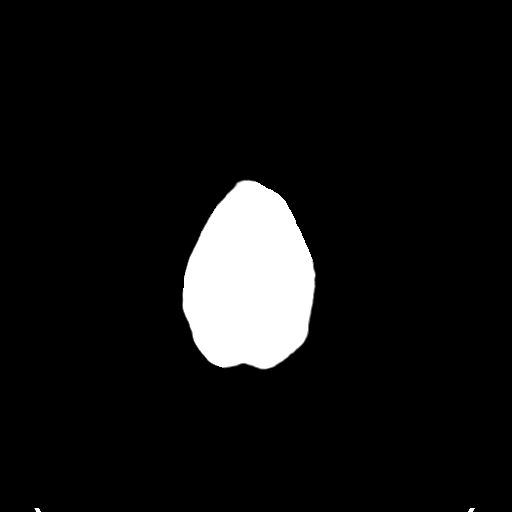

[Series 7: coronal soft tissue · coronal · 0.28mm/px · 3 of 66 slices shown]
[im 22/66  brain]
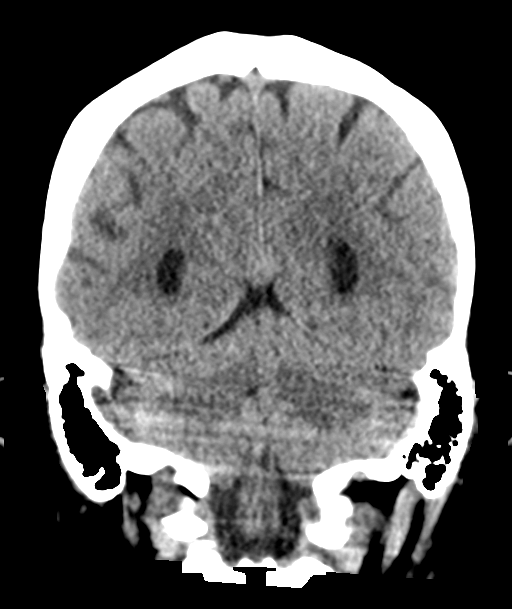
[im 29/66  brain]
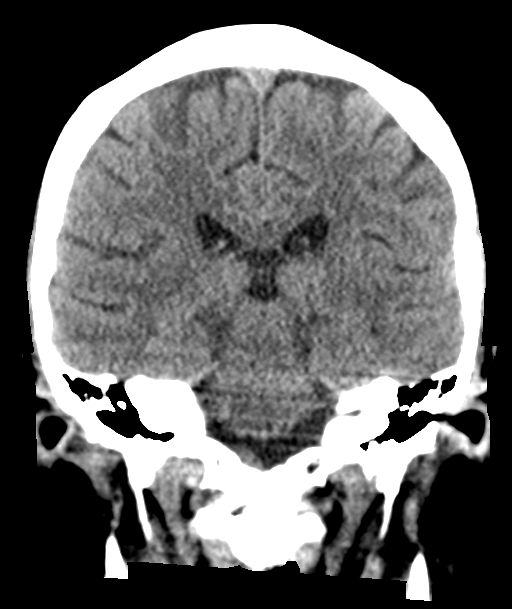
[im 37/66  brain]
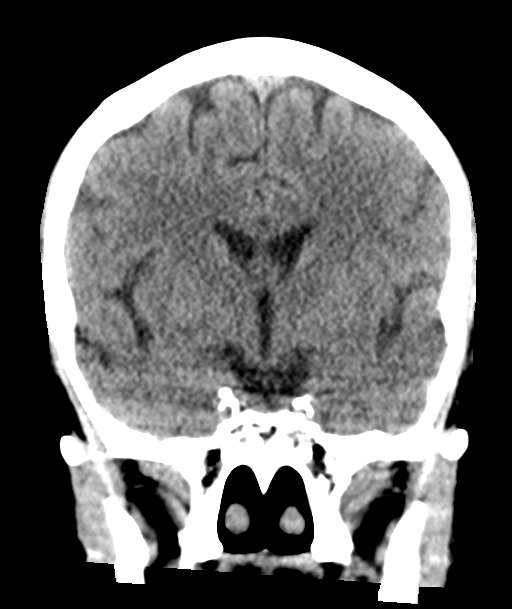

[Series 8: sagittal soft tissue · sagittal · 0.33mm/px · 3 of 47 slices shown]
[im 16/47  brain]
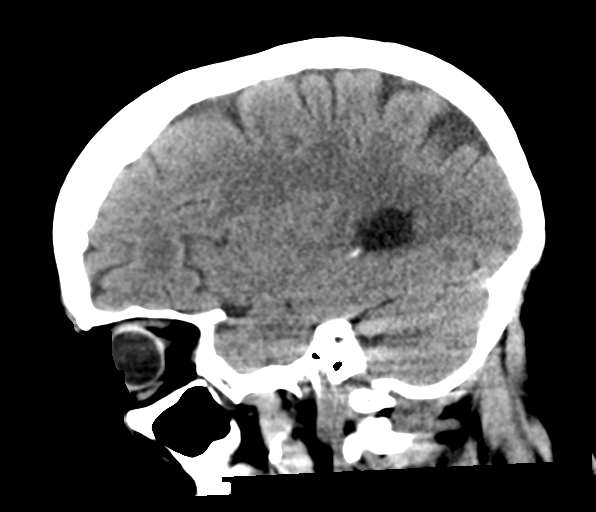
[im 24/47  brain]
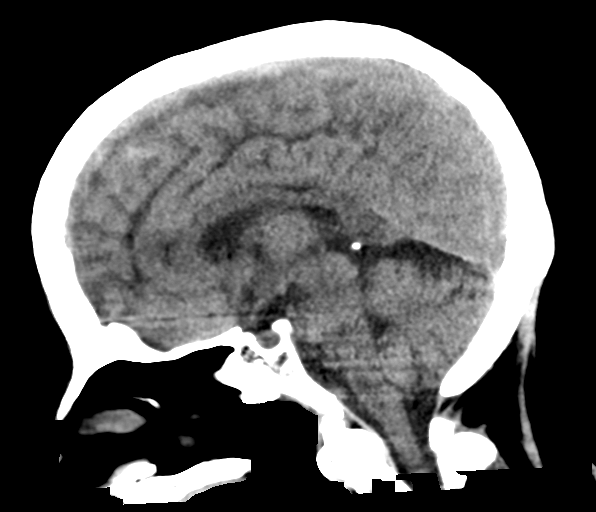
[im 31/47  brain]
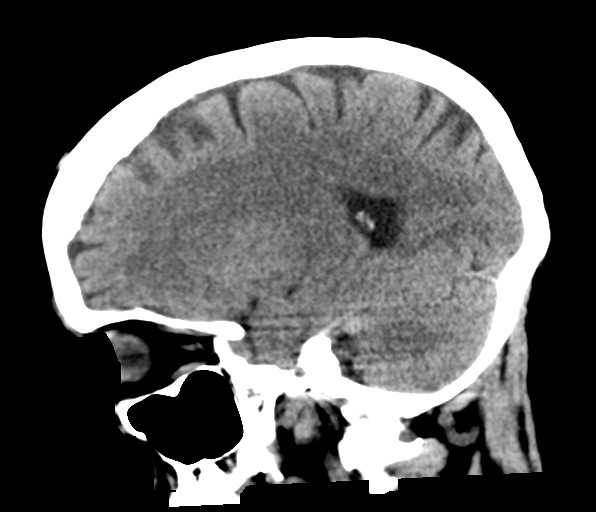

[16 of 47 positions shown; findings below may reference images not displayed]

FINDINGS: Brain: No evidence of acute infarction, hemorrhage, hydrocephalus,
extra-axial collection or mass lesion/mass effect.

Vascular: No hyperdense vessel or unexpected calcification.

Skull: Normal. Negative for fracture or focal lesion.

Sinuses/Orbits: No acute finding.

Other: None.
IMPRESSION: No acute intracranial abnormalities. No cause for the patient's
symptoms identified.

## 2021-03-31 IMAGING — DX DG LUMBAR SPINE COMPLETE 4+V
5 series · 5 of 5 positions shown · non-contrast
Comparison: None.

CLINICAL DATA: Patient fell 2 days ago and then again today.  Pain.

EXAM:
LUMBAR SPINE - COMPLETE 4+ VIEW

[l-spine ap]
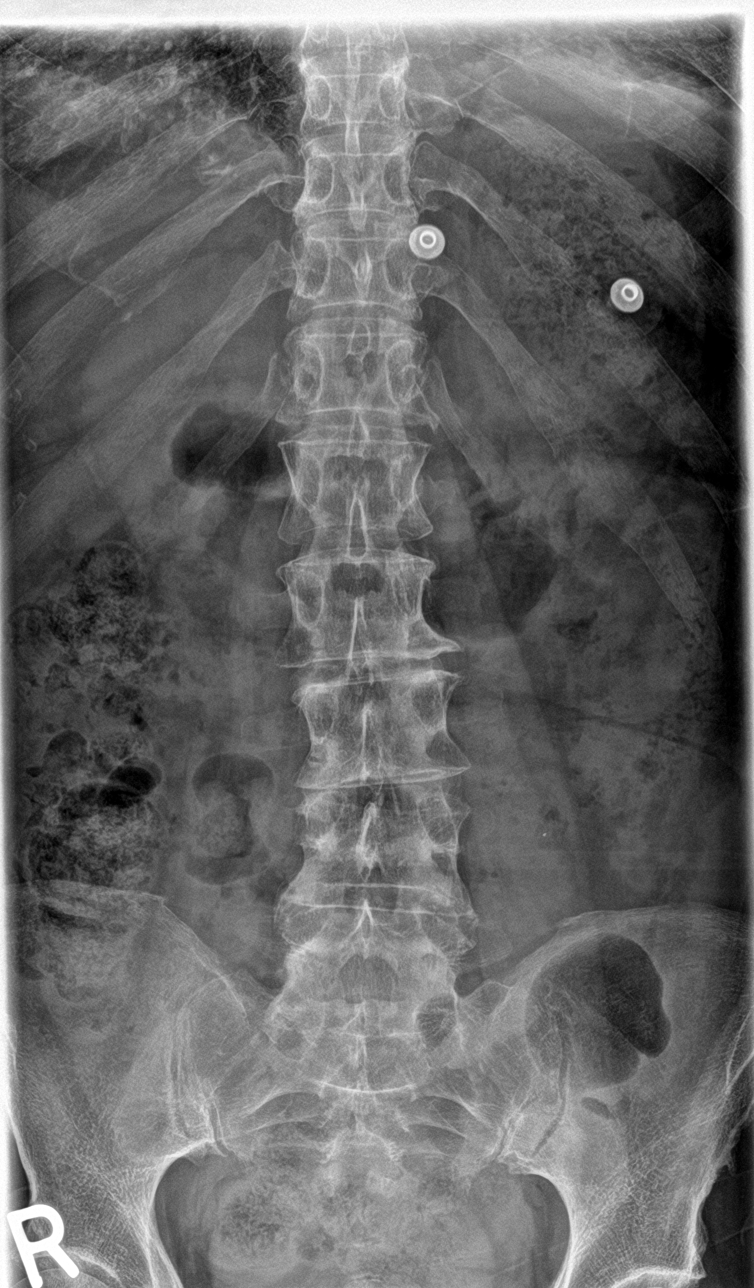

[l-spine obl (1 of 2)]
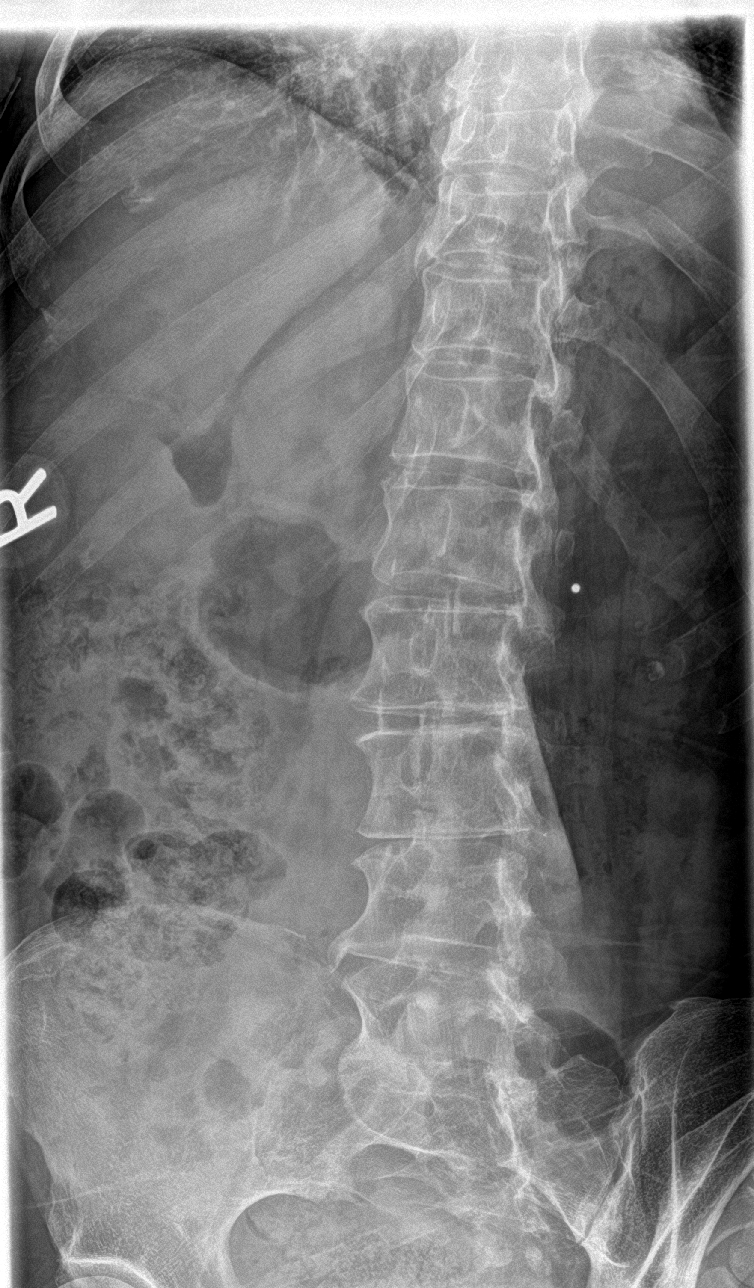

[l-spine obl (2 of 2)]
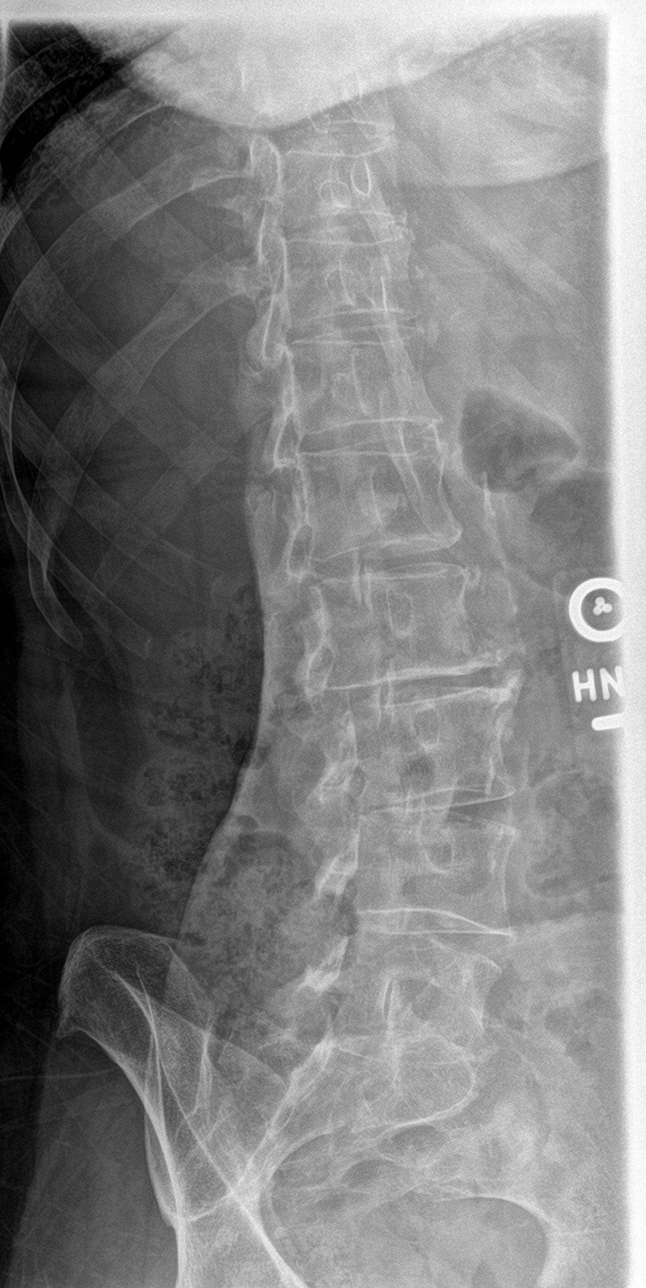

[l-spine lat]
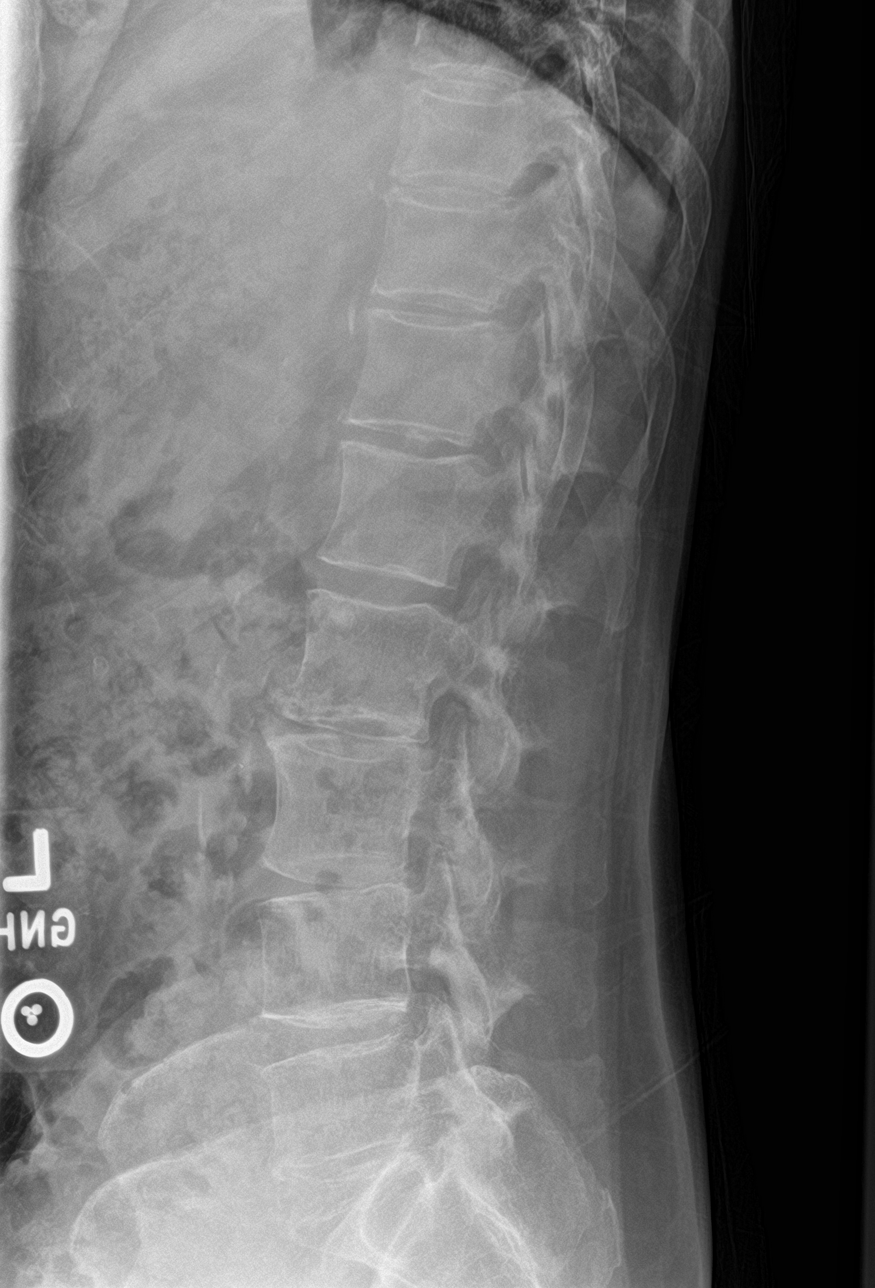

[l-spine spot]
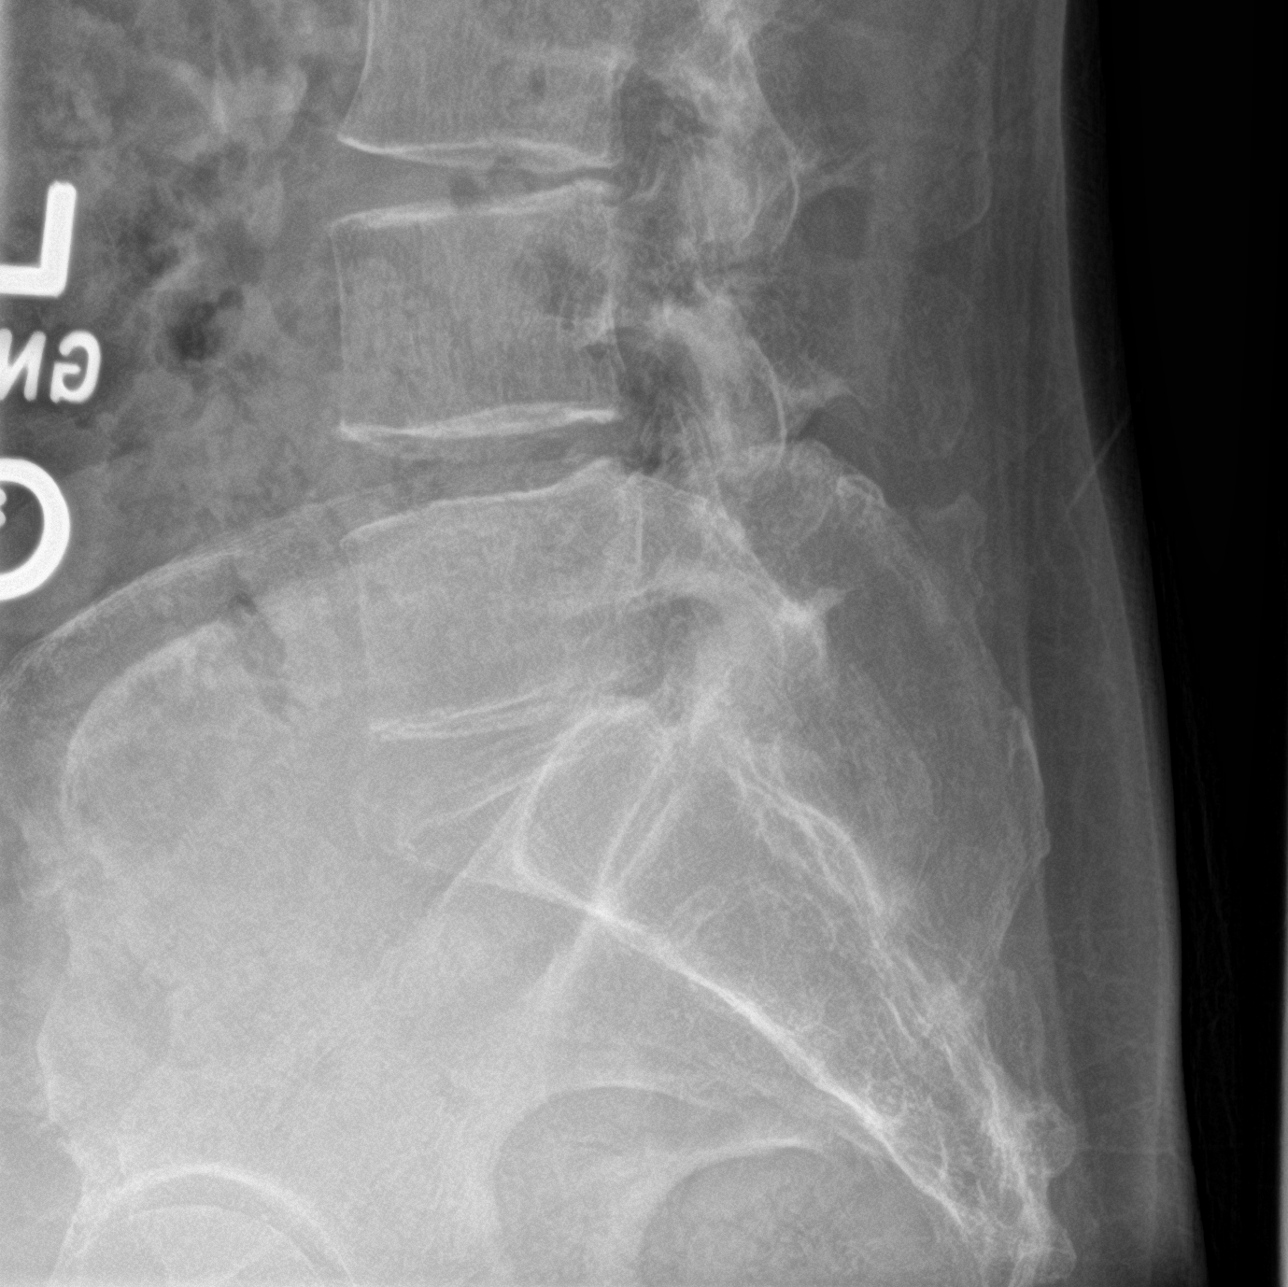

[5 of 5 positions shown; findings below may reference images not displayed]

FINDINGS: Mild multilevel degenerative disc disease. No fracture or traumatic
malalignment. Minimal calcified atherosclerosis in the abdominal
aorta.
IMPRESSION: 1. No fracture or traumatic malalignment.
2. Mild degenerative changes.
3. Mild calcified atherosclerosis in the abdominal aorta.

## 2021-04-11 ENCOUNTER — Ambulatory Visit: Payer: Medicare PPO | Attending: Internal Medicine

## 2021-04-11 ENCOUNTER — Other Ambulatory Visit: Payer: Self-pay

## 2021-04-11 ENCOUNTER — Other Ambulatory Visit (HOSPITAL_BASED_OUTPATIENT_CLINIC_OR_DEPARTMENT_OTHER): Payer: Self-pay

## 2021-04-11 DIAGNOSIS — Z23 Encounter for immunization: Secondary | ICD-10-CM

## 2021-04-11 MED ORDER — PFIZER COVID-19 VAC BIVALENT 30 MCG/0.3ML IM SUSP
INTRAMUSCULAR | 0 refills | Status: AC
  Filled 2021-04-11: qty 0.3, 1d supply, fill #0

## 2021-04-11 NOTE — Progress Notes (Signed)
   Covid-19 Vaccination Clinic  Name:  Norma Clark    MRN: [id number] DOB: Mar 21, 1946  04/11/2021  [name] was observed post Covid-19 immunization for 15 minutes without incident. She was provided with Vaccine Information Sheet and instruction to access the V-Safe system.   [name] was instructed to call 911 with any severe reactions post vaccine: Difficulty breathing  Swelling of face and throat  A fast heartbeat  A bad rash all over body  Dizziness and weakness   Immunizations Administered     Name Date Dose VIS Date Route   Pfizer Covid-19 Vaccine Bivalent Booster 04/11/2021  3:01 PM 0.3 mL 02/07/2021 Intramuscular   Manufacturer: [employer], [employer]   Lot: [id number]   NDC: [phone]

## 2021-04-24 DIAGNOSIS — M810 Age-related osteoporosis without current pathological fracture: Secondary | ICD-10-CM | POA: Diagnosis not present

## 2021-04-24 DIAGNOSIS — I10 Essential (primary) hypertension: Secondary | ICD-10-CM | POA: Diagnosis not present

## 2021-04-24 DIAGNOSIS — E7800 Pure hypercholesterolemia, unspecified: Secondary | ICD-10-CM | POA: Diagnosis not present

## 2021-04-24 DIAGNOSIS — E1169 Type 2 diabetes mellitus with other specified complication: Secondary | ICD-10-CM | POA: Diagnosis not present

## 2021-04-24 DIAGNOSIS — D508 Other iron deficiency anemias: Secondary | ICD-10-CM | POA: Diagnosis not present

## 2021-05-21 DIAGNOSIS — D508 Other iron deficiency anemias: Secondary | ICD-10-CM | POA: Diagnosis not present

## 2021-05-21 DIAGNOSIS — M810 Age-related osteoporosis without current pathological fracture: Secondary | ICD-10-CM | POA: Diagnosis not present

## 2021-05-21 DIAGNOSIS — I10 Essential (primary) hypertension: Secondary | ICD-10-CM | POA: Diagnosis not present

## 2021-05-21 DIAGNOSIS — E1169 Type 2 diabetes mellitus with other specified complication: Secondary | ICD-10-CM | POA: Diagnosis not present

## 2021-05-21 DIAGNOSIS — E7800 Pure hypercholesterolemia, unspecified: Secondary | ICD-10-CM | POA: Diagnosis not present

## 2021-06-08 DIAGNOSIS — H40013 Open angle with borderline findings, low risk, bilateral: Secondary | ICD-10-CM | POA: Diagnosis not present

## 2021-06-08 DIAGNOSIS — E119 Type 2 diabetes mellitus without complications: Secondary | ICD-10-CM | POA: Diagnosis not present

## 2021-08-21 DIAGNOSIS — N950 Postmenopausal bleeding: Secondary | ICD-10-CM | POA: Diagnosis not present

## 2021-08-21 DIAGNOSIS — K439 Ventral hernia without obstruction or gangrene: Secondary | ICD-10-CM | POA: Diagnosis not present

## 2021-08-21 DIAGNOSIS — N813 Complete uterovaginal prolapse: Secondary | ICD-10-CM | POA: Diagnosis not present

## 2021-10-05 DIAGNOSIS — E1169 Type 2 diabetes mellitus with other specified complication: Secondary | ICD-10-CM | POA: Diagnosis not present

## 2021-10-05 DIAGNOSIS — Z79899 Other long term (current) drug therapy: Secondary | ICD-10-CM | POA: Diagnosis not present

## 2021-10-05 DIAGNOSIS — Z1389 Encounter for screening for other disorder: Secondary | ICD-10-CM | POA: Diagnosis not present

## 2021-10-05 DIAGNOSIS — I700 Atherosclerosis of aorta: Secondary | ICD-10-CM | POA: Diagnosis not present

## 2021-10-05 DIAGNOSIS — I10 Essential (primary) hypertension: Secondary | ICD-10-CM | POA: Diagnosis not present

## 2021-10-05 DIAGNOSIS — E7800 Pure hypercholesterolemia, unspecified: Secondary | ICD-10-CM | POA: Diagnosis not present

## 2021-10-05 DIAGNOSIS — Z0000 Encounter for general adult medical examination without abnormal findings: Secondary | ICD-10-CM | POA: Diagnosis not present

## 2021-12-06 DIAGNOSIS — H40013 Open angle with borderline findings, low risk, bilateral: Secondary | ICD-10-CM | POA: Diagnosis not present

## 2021-12-21 DIAGNOSIS — E119 Type 2 diabetes mellitus without complications: Secondary | ICD-10-CM | POA: Diagnosis not present

## 2021-12-21 DIAGNOSIS — Z809 Family history of malignant neoplasm, unspecified: Secondary | ICD-10-CM | POA: Diagnosis not present

## 2021-12-21 DIAGNOSIS — R030 Elevated blood-pressure reading, without diagnosis of hypertension: Secondary | ICD-10-CM | POA: Diagnosis not present

## 2021-12-21 DIAGNOSIS — Z823 Family history of stroke: Secondary | ICD-10-CM | POA: Diagnosis not present

## 2021-12-21 DIAGNOSIS — Z7984 Long term (current) use of oral hypoglycemic drugs: Secondary | ICD-10-CM | POA: Diagnosis not present

## 2021-12-21 DIAGNOSIS — E785 Hyperlipidemia, unspecified: Secondary | ICD-10-CM | POA: Diagnosis not present

## 2022-01-14 ENCOUNTER — Ambulatory Visit: Payer: Medicare PPO | Attending: Internal Medicine

## 2022-01-14 DIAGNOSIS — Z23 Encounter for immunization: Secondary | ICD-10-CM

## 2022-01-28 DIAGNOSIS — Z6830 Body mass index (BMI) 30.0-30.9, adult: Secondary | ICD-10-CM | POA: Diagnosis not present

## 2022-01-28 DIAGNOSIS — Z7901 Long term (current) use of anticoagulants: Secondary | ICD-10-CM | POA: Diagnosis not present

## 2022-01-28 DIAGNOSIS — K439 Ventral hernia without obstruction or gangrene: Secondary | ICD-10-CM | POA: Diagnosis not present

## 2022-02-08 DIAGNOSIS — E7800 Pure hypercholesterolemia, unspecified: Secondary | ICD-10-CM | POA: Diagnosis not present

## 2022-02-08 DIAGNOSIS — E1169 Type 2 diabetes mellitus with other specified complication: Secondary | ICD-10-CM | POA: Diagnosis not present

## 2022-02-08 DIAGNOSIS — Z79899 Other long term (current) drug therapy: Secondary | ICD-10-CM | POA: Diagnosis not present

## 2022-03-06 DIAGNOSIS — Z1211 Encounter for screening for malignant neoplasm of colon: Secondary | ICD-10-CM | POA: Diagnosis not present

## 2022-03-06 DIAGNOSIS — K648 Other hemorrhoids: Secondary | ICD-10-CM | POA: Diagnosis not present

## 2022-04-17 DIAGNOSIS — E7800 Pure hypercholesterolemia, unspecified: Secondary | ICD-10-CM | POA: Diagnosis not present

## 2022-04-17 DIAGNOSIS — I10 Essential (primary) hypertension: Secondary | ICD-10-CM | POA: Diagnosis not present

## 2022-04-17 DIAGNOSIS — E1169 Type 2 diabetes mellitus with other specified complication: Secondary | ICD-10-CM | POA: Diagnosis not present

## 2022-05-07 ENCOUNTER — Other Ambulatory Visit (HOSPITAL_BASED_OUTPATIENT_CLINIC_OR_DEPARTMENT_OTHER): Payer: Self-pay

## 2022-05-07 MED ORDER — COMIRNATY 30 MCG/0.3ML IM SUSY
PREFILLED_SYRINGE | INTRAMUSCULAR | 0 refills | Status: AC
  Filled 2022-05-07: qty 0.3, 1d supply, fill #0

## 2022-10-03 DIAGNOSIS — Z811 Family history of alcohol abuse and dependence: Secondary | ICD-10-CM | POA: Diagnosis not present

## 2022-10-03 DIAGNOSIS — I10 Essential (primary) hypertension: Secondary | ICD-10-CM | POA: Diagnosis not present

## 2022-10-03 DIAGNOSIS — E785 Hyperlipidemia, unspecified: Secondary | ICD-10-CM | POA: Diagnosis not present

## 2022-10-03 DIAGNOSIS — E119 Type 2 diabetes mellitus without complications: Secondary | ICD-10-CM | POA: Diagnosis not present

## 2022-10-03 DIAGNOSIS — Z8249 Family history of ischemic heart disease and other diseases of the circulatory system: Secondary | ICD-10-CM | POA: Diagnosis not present

## 2022-10-03 DIAGNOSIS — Z7983 Long term (current) use of bisphosphonates: Secondary | ICD-10-CM | POA: Diagnosis not present

## 2022-10-03 DIAGNOSIS — M8580 Other specified disorders of bone density and structure, unspecified site: Secondary | ICD-10-CM | POA: Diagnosis not present

## 2022-10-03 DIAGNOSIS — Z7984 Long term (current) use of oral hypoglycemic drugs: Secondary | ICD-10-CM | POA: Diagnosis not present

## 2022-10-03 DIAGNOSIS — I700 Atherosclerosis of aorta: Secondary | ICD-10-CM | POA: Diagnosis not present

## 2022-12-11 DIAGNOSIS — H40013 Open angle with borderline findings, low risk, bilateral: Secondary | ICD-10-CM | POA: Diagnosis not present

## 2022-12-11 DIAGNOSIS — E119 Type 2 diabetes mellitus without complications: Secondary | ICD-10-CM | POA: Diagnosis not present

## 2022-12-20 DIAGNOSIS — H40013 Open angle with borderline findings, low risk, bilateral: Secondary | ICD-10-CM | POA: Diagnosis not present

## 2022-12-20 DIAGNOSIS — H40053 Ocular hypertension, bilateral: Secondary | ICD-10-CM | POA: Diagnosis not present

## 2023-01-06 ENCOUNTER — Other Ambulatory Visit: Payer: Self-pay | Admitting: Internal Medicine

## 2023-01-06 DIAGNOSIS — I700 Atherosclerosis of aorta: Secondary | ICD-10-CM | POA: Diagnosis not present

## 2023-01-06 DIAGNOSIS — E119 Type 2 diabetes mellitus without complications: Secondary | ICD-10-CM | POA: Diagnosis not present

## 2023-01-06 DIAGNOSIS — M810 Age-related osteoporosis without current pathological fracture: Secondary | ICD-10-CM

## 2023-01-06 DIAGNOSIS — E7800 Pure hypercholesterolemia, unspecified: Secondary | ICD-10-CM | POA: Diagnosis not present

## 2023-01-06 DIAGNOSIS — I10 Essential (primary) hypertension: Secondary | ICD-10-CM | POA: Diagnosis not present

## 2023-01-06 DIAGNOSIS — H6123 Impacted cerumen, bilateral: Secondary | ICD-10-CM | POA: Diagnosis not present

## 2023-01-06 DIAGNOSIS — Z79899 Other long term (current) drug therapy: Secondary | ICD-10-CM | POA: Diagnosis not present

## 2023-01-06 DIAGNOSIS — Z0000 Encounter for general adult medical examination without abnormal findings: Secondary | ICD-10-CM | POA: Diagnosis not present

## 2023-01-06 DIAGNOSIS — E1169 Type 2 diabetes mellitus with other specified complication: Secondary | ICD-10-CM | POA: Diagnosis not present

## 2023-02-28 ENCOUNTER — Other Ambulatory Visit (HOSPITAL_BASED_OUTPATIENT_CLINIC_OR_DEPARTMENT_OTHER): Payer: Self-pay

## 2023-02-28 MED ORDER — COVID-19 MRNA VAC-TRIS(PFIZER) 30 MCG/0.3ML IM SUSY
0.3000 mL | PREFILLED_SYRINGE | Freq: Once | INTRAMUSCULAR | 0 refills | Status: AC
  Filled 2023-02-28: qty 0.3, 1d supply, fill #0

## 2023-02-28 MED ORDER — INFLUENZA VAC A&B SURF ANT ADJ 0.5 ML IM SUSY
0.5000 mL | PREFILLED_SYRINGE | Freq: Once | INTRAMUSCULAR | 0 refills | Status: AC
  Filled 2023-02-28: qty 0.5, 1d supply, fill #0

## 2023-05-05 DIAGNOSIS — E1169 Type 2 diabetes mellitus with other specified complication: Secondary | ICD-10-CM | POA: Diagnosis not present

## 2023-06-23 DIAGNOSIS — H40013 Open angle with borderline findings, low risk, bilateral: Secondary | ICD-10-CM | POA: Diagnosis not present

## 2023-07-07 DIAGNOSIS — I700 Atherosclerosis of aorta: Secondary | ICD-10-CM | POA: Diagnosis not present

## 2023-07-07 DIAGNOSIS — M810 Age-related osteoporosis without current pathological fracture: Secondary | ICD-10-CM | POA: Diagnosis not present

## 2023-07-07 DIAGNOSIS — I10 Essential (primary) hypertension: Secondary | ICD-10-CM | POA: Diagnosis not present

## 2023-07-07 DIAGNOSIS — E1169 Type 2 diabetes mellitus with other specified complication: Secondary | ICD-10-CM | POA: Diagnosis not present

## 2023-07-07 DIAGNOSIS — E7800 Pure hypercholesterolemia, unspecified: Secondary | ICD-10-CM | POA: Diagnosis not present

## 2023-08-01 ENCOUNTER — Ambulatory Visit
Admission: RE | Admit: 2023-08-01 | Discharge: 2023-08-01 | Disposition: A | Discharge status: Home / Self Care | Payer: Medicare PPO | Source: Ambulatory Visit | Attending: Internal Medicine | Admitting: Internal Medicine

## 2023-08-01 DIAGNOSIS — M810 Age-related osteoporosis without current pathological fracture: Secondary | ICD-10-CM

## 2023-08-01 DIAGNOSIS — N958 Other specified menopausal and perimenopausal disorders: Secondary | ICD-10-CM | POA: Diagnosis not present

## 2023-08-01 DIAGNOSIS — M8588 Other specified disorders of bone density and structure, other site: Secondary | ICD-10-CM | POA: Diagnosis not present

## 2023-08-01 DIAGNOSIS — E2839 Other primary ovarian failure: Secondary | ICD-10-CM | POA: Diagnosis not present

## 2023-08-05 DIAGNOSIS — E7800 Pure hypercholesterolemia, unspecified: Secondary | ICD-10-CM | POA: Diagnosis not present

## 2023-08-05 DIAGNOSIS — E1169 Type 2 diabetes mellitus with other specified complication: Secondary | ICD-10-CM | POA: Diagnosis not present

## 2023-08-05 DIAGNOSIS — I10 Essential (primary) hypertension: Secondary | ICD-10-CM | POA: Diagnosis not present

## 2023-12-22 DIAGNOSIS — H5340 Unspecified visual field defects: Secondary | ICD-10-CM | POA: Diagnosis not present

## 2023-12-22 DIAGNOSIS — H40053 Ocular hypertension, bilateral: Secondary | ICD-10-CM | POA: Diagnosis not present

## 2024-01-08 DIAGNOSIS — E7800 Pure hypercholesterolemia, unspecified: Secondary | ICD-10-CM | POA: Diagnosis not present

## 2024-01-08 DIAGNOSIS — Z79899 Other long term (current) drug therapy: Secondary | ICD-10-CM | POA: Diagnosis not present

## 2024-01-08 DIAGNOSIS — Z1331 Encounter for screening for depression: Secondary | ICD-10-CM | POA: Diagnosis not present

## 2024-01-08 DIAGNOSIS — I10 Essential (primary) hypertension: Secondary | ICD-10-CM | POA: Diagnosis not present

## 2024-01-08 DIAGNOSIS — M810 Age-related osteoporosis without current pathological fracture: Secondary | ICD-10-CM | POA: Diagnosis not present

## 2024-01-08 DIAGNOSIS — Z0000 Encounter for general adult medical examination without abnormal findings: Secondary | ICD-10-CM | POA: Diagnosis not present

## 2024-01-08 DIAGNOSIS — E1169 Type 2 diabetes mellitus with other specified complication: Secondary | ICD-10-CM | POA: Diagnosis not present

## 2024-01-08 DIAGNOSIS — Z23 Encounter for immunization: Secondary | ICD-10-CM | POA: Diagnosis not present

## 2024-01-08 DIAGNOSIS — F79 Unspecified intellectual disabilities: Secondary | ICD-10-CM | POA: Diagnosis not present

## 2024-02-19 ENCOUNTER — Other Ambulatory Visit (HOSPITAL_BASED_OUTPATIENT_CLINIC_OR_DEPARTMENT_OTHER): Payer: Self-pay

## 2024-02-19 MED ORDER — FLUZONE HIGH-DOSE 0.5 ML IM SUSY
0.5000 mL | PREFILLED_SYRINGE | Freq: Once | INTRAMUSCULAR | 0 refills | Status: AC
  Filled 2024-02-19: qty 0.5, 1d supply, fill #0

## 2024-02-23 DIAGNOSIS — H903 Sensorineural hearing loss, bilateral: Secondary | ICD-10-CM | POA: Diagnosis not present

## 2024-03-02 ENCOUNTER — Other Ambulatory Visit (HOSPITAL_BASED_OUTPATIENT_CLINIC_OR_DEPARTMENT_OTHER): Payer: Self-pay

## 2024-03-02 MED ORDER — COMIRNATY 30 MCG/0.3ML IM SUSY
0.3000 mL | PREFILLED_SYRINGE | Freq: Once | INTRAMUSCULAR | 0 refills | Status: AC
  Filled 2024-03-02: qty 0.3, 1d supply, fill #0

## 2024-03-17 DIAGNOSIS — E119 Type 2 diabetes mellitus without complications: Secondary | ICD-10-CM | POA: Diagnosis not present

## 2024-03-17 DIAGNOSIS — N959 Unspecified menopausal and perimenopausal disorder: Secondary | ICD-10-CM | POA: Diagnosis not present

## 2024-03-17 DIAGNOSIS — Z8249 Family history of ischemic heart disease and other diseases of the circulatory system: Secondary | ICD-10-CM | POA: Diagnosis not present

## 2024-03-17 DIAGNOSIS — E785 Hyperlipidemia, unspecified: Secondary | ICD-10-CM | POA: Diagnosis not present

## 2024-03-17 DIAGNOSIS — M810 Age-related osteoporosis without current pathological fracture: Secondary | ICD-10-CM | POA: Diagnosis not present

## 2024-03-17 DIAGNOSIS — I10 Essential (primary) hypertension: Secondary | ICD-10-CM | POA: Diagnosis not present

## 2024-03-17 DIAGNOSIS — F79 Unspecified intellectual disabilities: Secondary | ICD-10-CM | POA: Diagnosis not present

## 2024-03-17 DIAGNOSIS — Z833 Family history of diabetes mellitus: Secondary | ICD-10-CM | POA: Diagnosis not present

## 2024-03-17 DIAGNOSIS — Z7984 Long term (current) use of oral hypoglycemic drugs: Secondary | ICD-10-CM | POA: Diagnosis not present

## 2024-03-23 DIAGNOSIS — H903 Sensorineural hearing loss, bilateral: Secondary | ICD-10-CM | POA: Diagnosis not present

## 2024-04-20 DIAGNOSIS — E1169 Type 2 diabetes mellitus with other specified complication: Secondary | ICD-10-CM | POA: Diagnosis not present
